# Patient Record
Sex: Female | Born: 1990 | Race: White | Hispanic: No | Marital: Single | Smoking: Former smoker
Health system: Southern US, Community
[De-identification: ages and names within clinical notes are randomized; demographics above are authoritative.]

## PROBLEM LIST (undated history)

## (undated) DIAGNOSIS — F32A Depression, unspecified: Secondary | ICD-10-CM

## (undated) DIAGNOSIS — F419 Anxiety disorder, unspecified: Secondary | ICD-10-CM

## (undated) DIAGNOSIS — F329 Major depressive disorder, single episode, unspecified: Secondary | ICD-10-CM

## (undated) DIAGNOSIS — R011 Cardiac murmur, unspecified: Secondary | ICD-10-CM

## (undated) DIAGNOSIS — R519 Headache, unspecified: Secondary | ICD-10-CM

## (undated) DIAGNOSIS — R51 Headache: Secondary | ICD-10-CM

## (undated) DIAGNOSIS — B009 Herpesviral infection, unspecified: Secondary | ICD-10-CM

## (undated) DIAGNOSIS — Z3490 Encounter for supervision of normal pregnancy, unspecified, unspecified trimester: Secondary | ICD-10-CM

## (undated) HISTORY — PX: NO PAST SURGERIES: SHX2092

---
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
## 2008-02-19 ENCOUNTER — Inpatient Hospital Stay (HOSPITAL_COMMUNITY): Admission: AD | Admit: 2008-02-19 | Discharge: 2008-02-21 | Payer: Self-pay | Admitting: Obstetrics and Gynecology
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
## 2011-04-04 NOTE — Discharge Summary (Signed)
NAME:  [name], [name] NO.:  [id number]   MEDICAL RECORD NO.:  [id number]          PATIENT TYPE:  INP   LOCATION:  0199                          FACILITY:  [facility]   PHYSICIAN:  Jasmine Pang, M.D. DATE OF BIRTH:  1991/05/26   DATE OF ADMISSION:  05/02/2005  DATE OF DISCHARGE:                                 DISCHARGE SUMMARY   IDENTIFICATION:  The patient is a 20 year old Caucasian female who I see on  an outpatient basis.  She had just been discharged form this unit one week  ago for severe mood and behavioral dysregulation.  Since discharge, she has  become increasingly violent towards her mother and agitated.   DICTATION ENDED HERE.       BHS/MEDQ  D:  05/03/2005  T:  05/04/2005  Job:  [id number]

## 2011-08-12 LAB — CBC
HCT: 29.7 — ABNORMAL LOW
HCT: 36.2
Hemoglobin: 10.4 — ABNORMAL LOW
Hemoglobin: 12.6
MCHC: 34.7
MCHC: 35.2
MCV: 96.9
MCV: 97.6
Platelets: 212
Platelets: 218
RBC: 3.06 — ABNORMAL LOW
RBC: 3.71 — ABNORMAL LOW
RDW: 12
RDW: 12.1
WBC: 11.2 — ABNORMAL HIGH
WBC: 22 — ABNORMAL HIGH

## 2011-08-12 LAB — RPR: RPR Ser Ql: NONREACTIVE

## 2012-04-28 ENCOUNTER — Ambulatory Visit (HOSPITAL_COMMUNITY): Payer: Federal, State, Local not specified - PPO | Admitting: Psychiatry

## 2012-05-30 ENCOUNTER — Emergency Department (HOSPITAL_COMMUNITY)
Admission: EM | Admit: 2012-05-30 | Discharge: 2012-05-31 | Disposition: A | Discharge status: Home / Self Care | Payer: Federal, State, Local not specified - PPO | Attending: Emergency Medicine | Admitting: Emergency Medicine

## 2012-05-30 ENCOUNTER — Encounter (HOSPITAL_COMMUNITY): Payer: Self-pay | Admitting: Emergency Medicine

## 2012-05-30 DIAGNOSIS — B9689 Other specified bacterial agents as the cause of diseases classified elsewhere: Secondary | ICD-10-CM | POA: Insufficient documentation

## 2012-05-30 DIAGNOSIS — R109 Unspecified abdominal pain: Secondary | ICD-10-CM

## 2012-05-30 DIAGNOSIS — N760 Acute vaginitis: Secondary | ICD-10-CM | POA: Insufficient documentation

## 2012-05-30 DIAGNOSIS — A499 Bacterial infection, unspecified: Secondary | ICD-10-CM | POA: Insufficient documentation

## 2012-05-30 DIAGNOSIS — A599 Trichomoniasis, unspecified: Secondary | ICD-10-CM | POA: Insufficient documentation

## 2012-05-30 LAB — BASIC METABOLIC PANEL
CO2: 28 mEq/L (ref 19–32)
Calcium: 9.4 mg/dL (ref 8.4–10.5)
Glucose, Bld: 115 mg/dL — ABNORMAL HIGH (ref 70–99)
Sodium: 140 mEq/L (ref 135–145)

## 2012-05-30 LAB — CBC WITH DIFFERENTIAL/PLATELET
Eosinophils Relative: 0 % (ref 0–5)
HCT: 36.4 % (ref 36.0–46.0)
Hemoglobin: 12.4 g/dL (ref 12.0–15.0)
Lymphocytes Relative: 16 % (ref 12–46)
Lymphs Abs: 2 10*3/uL (ref 0.7–4.0)
MCV: 95.5 fL (ref 78.0–100.0)
Platelets: 194 10*3/uL (ref 150–400)
RBC: 3.81 MIL/uL — ABNORMAL LOW (ref 3.87–5.11)
WBC: 12.8 10*3/uL — ABNORMAL HIGH (ref 4.0–10.5)

## 2012-05-30 LAB — URINALYSIS, ROUTINE W REFLEX MICROSCOPIC
Glucose, UA: NEGATIVE mg/dL
Specific Gravity, Urine: 1.019 (ref 1.005–1.030)
pH: 7 (ref 5.0–8.0)

## 2012-05-30 LAB — WET PREP, GENITAL: Yeast Wet Prep HPF POC: NONE SEEN

## 2012-05-30 LAB — URINE MICROSCOPIC-ADD ON

## 2012-05-30 LAB — POCT PREGNANCY, URINE: Preg Test, Ur: NEGATIVE

## 2012-05-30 NOTE — ED Notes (Addendum)
Pt reports she was watching tv on her bed when she had a sudden onset of abd pain mid ambilicus , reports normal BM today

## 2012-05-30 NOTE — ED Provider Notes (Signed)
History     CSN: [id number]  Arrival date & time 05/30/12  2152   First MD Initiated Contact with Patient 05/30/12 2217      Chief Complaint  Patient presents with  . Abdominal Pain    resolved    (Consider location/radiation/quality/duration/timing/severity/associated sxs/prior treatment) Patient is a 21 y.o. female presenting with abdominal pain. The history is provided by the patient.  Abdominal Pain The primary symptoms of the illness include abdominal pain and nausea. The primary symptoms of the illness do not include fever, fatigue, shortness of breath, vomiting, diarrhea, hematemesis, hematochezia, dysuria, vaginal discharge or vaginal bleeding. The current episode started less than 1 hour ago. The onset of the illness was sudden. The problem has been gradually improving.  The abdominal pain is located in the RLQ and epigastric region. The abdominal pain does not radiate. Relieved by: rest. Exacerbated by: nothing.  The patient has not had a change in bowel habit. Symptoms associated with the illness do not include chills, diaphoresis, urgency, hematuria or back pain.    History reviewed. No pertinent past medical history.  History reviewed. No pertinent past surgical history.  History reviewed. No pertinent family history.  History  Substance Use Topics  . Smoking status: Not on file  . Smokeless tobacco: Not on file  . Alcohol Use: Not on file    OB History    Grav Para Term Preterm Abortions TAB SAB Ect Mult Living                  Review of Systems  Constitutional: Negative for fever, chills, diaphoresis and fatigue.  HENT: Negative for ear pain, congestion, sore throat, facial swelling, mouth sores, trouble swallowing, neck pain and neck stiffness.   Eyes: Negative.   Respiratory: Negative for apnea, cough, chest tightness, shortness of breath and wheezing.   Cardiovascular: Negative for chest pain, palpitations and leg swelling.  Gastrointestinal: Positive  for nausea and abdominal pain. Negative for vomiting, diarrhea, hematochezia, abdominal distention and hematemesis.  Genitourinary: Negative for dysuria, urgency, hematuria, flank pain, vaginal bleeding, vaginal discharge, difficulty urinating, menstrual problem and pelvic pain.  Musculoskeletal: Negative for back pain and gait problem.  Skin: Negative for rash and wound.  Neurological: Negative for dizziness, tremors, seizures, syncope, facial asymmetry, numbness and headaches.  Psychiatric/Behavioral: Negative.   All other systems reviewed and are negative.    Allergies  Review of patient's allergies indicates no known allergies.  Home Medications   Current Outpatient Rx  Name Route Sig Dispense Refill  . ADULT MULTIVITAMIN W/MINERALS CH Oral Take 1 tablet by mouth daily.      BP 104/58  Pulse 80  Temp 99.2 F (37.3 C) (Oral)  Resp 16  SpO2 98%  Physical Exam  Nursing note and vitals reviewed. Constitutional: She is oriented to person, place, and time. She appears well-developed and well-nourished. No distress.  HENT:  Head: Normocephalic and atraumatic.  Right Ear: External ear normal.  Left Ear: External ear normal.  Nose: Nose normal.  Mouth/Throat: Oropharynx is clear and moist. No oropharyngeal exudate.  Eyes: Conjunctivae and EOM are normal. Pupils are equal, round, and reactive to light. Right eye exhibits no discharge. Left eye exhibits no discharge.  Neck: Normal range of motion. Neck supple. No JVD present. No tracheal deviation present. No thyromegaly present.  Cardiovascular: Normal rate, regular rhythm, normal heart sounds and intact distal pulses.  Exam reveals no gallop and no friction rub.   No murmur heard. Pulmonary/Chest: Effort normal and  breath sounds normal. No respiratory distress. She has no wheezes. She has no rales. She exhibits no tenderness.  Abdominal: Soft. Bowel sounds are normal. She exhibits no distension. There is tenderness in the right  lower quadrant. There is tenderness at McBurney's point. There is no rigidity, no rebound, no guarding and negative Murphy's sign.  Genitourinary: Vagina normal and uterus normal. There is no rash, tenderness or lesion on the right labia. There is no rash, tenderness or lesion on the left labia. Uterus is not deviated, not enlarged, not fixed and not tender. Cervix exhibits discharge (whitish discharge moderate amount). Cervix exhibits no motion tenderness and no friability. Right adnexum displays no mass, no tenderness and no fullness. Left adnexum displays no mass, no tenderness and no fullness. No erythema, tenderness or bleeding around the vagina. No foreign body around the vagina. No signs of injury around the vagina. No vaginal discharge found.       Patient pain seems more intra-abdominal on the right side not associated with the adnexa  Musculoskeletal: Normal range of motion.  Lymphadenopathy:    She has no cervical adenopathy.  Neurological: She is alert and oriented to person, place, and time. No cranial nerve deficit. Coordination normal.  Skin: Skin is warm. No rash noted. She is not diaphoretic.  Psychiatric: She has a normal mood and affect. Her behavior is normal. Judgment and thought content normal.    ED Course  Procedures (including critical care time)   Labs Reviewed  URINALYSIS, ROUTINE W REFLEX MICROSCOPIC  CBC WITH DIFFERENTIAL  BASIC METABOLIC PANEL  GC/CHLAMYDIA PROBE AMP, GENITAL  WET PREP, GENITAL   No results found.   No diagnosis found.    MDM  21 year old female patient with noncontributory past medical history presents with acute onset right lower quadrant pain. Patient says she was sitting at home which had acute onset periumbilical/right lower quadrant abdominal pain that was worse with breathing and movement. Patient says is now improving but she still has soreness there. Patient denies urinary symptoms. Patient is sexually active her last menstrual  period was 4 weeks ago is not having any vaginal bleeding vaginal discharge or pain with sex. Patient not have any other symptoms. Exam normal as above concern for possible appendicitis. We'll get CBC BMP urine labs and CT abdomen.  Results for orders placed during the hospital encounter of 05/30/12  URINALYSIS, ROUTINE W REFLEX MICROSCOPIC      Component Value Range   Color, Urine YELLOW  YELLOW   APPearance CLOUDY (*) CLEAR   Specific Gravity, Urine 1.019  1.005 - 1.030   pH 7.0  5.0 - 8.0   Glucose, UA NEGATIVE  NEGATIVE mg/dL   Hgb urine dipstick TRACE (*) NEGATIVE   Bilirubin Urine NEGATIVE  NEGATIVE   Ketones, ur NEGATIVE  NEGATIVE mg/dL   Protein, ur NEGATIVE  NEGATIVE mg/dL   Urobilinogen, UA 0.2  0.0 - 1.0 mg/dL   Nitrite NEGATIVE  NEGATIVE   Leukocytes, UA MODERATE (*) NEGATIVE  CBC WITH DIFFERENTIAL      Component Value Range   WBC 12.8 (*) 4.0 - 10.5 K/uL   RBC 3.81 (*) 3.87 - 5.11 MIL/uL   Hemoglobin 12.4  12.0 - 15.0 g/dL   HCT 16.1  09.6 - 04.5 %   MCV 95.5  78.0 - 100.0 fL   MCH 32.5  26.0 - 34.0 pg   MCHC 34.1  30.0 - 36.0 g/dL   RDW 40.9 (*) 81.1 - 91.4 %   Platelets  194  150 - 400 K/uL   Neutrophils Relative 76  43 - 77 %   Neutro Abs 9.7 (*) 1.7 - 7.7 K/uL   Lymphocytes Relative 16  12 - 46 %   Lymphs Abs 2.0  0.7 - 4.0 K/uL   Monocytes Relative 8  3 - 12 %   Monocytes Absolute 1.0  0.1 - 1.0 K/uL   Eosinophils Relative 0  0 - 5 %   Eosinophils Absolute 0.0  0.0 - 0.7 K/uL   Basophils Relative 0  0 - 1 %   Basophils Absolute 0.0  0.0 - 0.1 K/uL  BASIC METABOLIC PANEL      Component Value Range   Sodium 140  135 - 145 mEq/L   Potassium 3.3 (*) 3.5 - 5.1 mEq/L   Chloride 104  96 - 112 mEq/L   CO2 28  19 - 32 mEq/L   Glucose, Bld 115 (*) 70 - 99 mg/dL   BUN 13  6 - 23 mg/dL   Creatinine, Ser 1.61  0.50 - 1.10 mg/dL   Calcium 9.4  8.4 - 09.6 mg/dL   GFR calc non Af Amer >90  >90 mL/min   GFR calc Af Amer >90  >90 mL/min  WET PREP, GENITAL       Component Value Range   Yeast Wet Prep HPF POC NONE SEEN  NONE SEEN   Trich, Wet Prep MODERATE (*) NONE SEEN   Clue Cells Wet Prep HPF POC FEW (*) NONE SEEN   WBC, Wet Prep HPF POC FEW (*) NONE SEEN  POCT PREGNANCY, URINE      Component Value Range   Preg Test, Ur NEGATIVE  NEGATIVE  URINE MICROSCOPIC-ADD ON      Component Value Range   Squamous Epithelial / LPF MANY (*) RARE   WBC, UA 3-6  <3 WBC/hpf   RBC / HPF 0-2  <3 RBC/hpf   Bacteria, UA MANY (*) RARE    Labs show patient has evidence of bacterial vaginosis and trich. Patient still with acute onset right lower quadrant pain that is being assessed with imaging. At this point in time awaiting CT scan. Dr. Juleen China Will followup with that imaging and disposition patient   Case discussed with Dr. Sharlot Gowda, MD 05/31/12 0002

## 2012-05-30 NOTE — ED Notes (Addendum)
Pt. Resting comfortably. Pain described as "pressure" 1/10. Pt has complete 1 cup of contrast. Working on the 2nd cup. A.O. X 4. NAD.

## 2012-05-31 ENCOUNTER — Emergency Department (HOSPITAL_COMMUNITY): Payer: Federal, State, Local not specified - PPO

## 2012-05-31 ENCOUNTER — Encounter (HOSPITAL_COMMUNITY): Payer: Self-pay | Admitting: Radiology

## 2012-05-31 LAB — GC/CHLAMYDIA PROBE AMP, GENITAL: Chlamydia, DNA Probe: NEGATIVE

## 2012-05-31 MED ORDER — METRONIDAZOLE 500 MG PO TABS: 500.0000 mg | ORAL_TABLET | Freq: Two times a day (BID) | ORAL | Status: AC

## 2012-05-31 MED ORDER — IOHEXOL 300 MG/ML  SOLN
100.0000 mL | Freq: Once | INTRAMUSCULAR | Status: AC | PRN
  Administered 2012-05-31: 100 mL via INTRAVENOUS

## 2012-05-31 NOTE — ED Notes (Signed)
Patient back from CT; currently sitting up in bed; no respiratory or acute distress noted.  Patient updated on plan of care; informed patient that we are currently waiting on results from CT scan.  Patient has no other questions or concerns at this time; will continue to monitor.

## 2012-05-31 NOTE — ED Notes (Signed)
Dr. Kohut at bedside 

## 2012-05-31 NOTE — ED Notes (Signed)
Pt. D/C home.  Pt. Instructed to complete fully dose of antibiotic. Instructed not to have sex until antibiotic is complete and partner is treated. Significant other at bedside and instructed to seek treatment for testing ASAP. Pt. And significant other verbalized understanding of teaching. A.O. X 4. Ambulatory. NAD.

## 2012-06-03 NOTE — ED Provider Notes (Signed)
I saw and evaluated the patient, reviewed the resident's note and I agree with the findings and plan.  Ct Abdomen Pelvis W Contrast  05/31/2012  *RADIOLOGY REPORT*  Clinical Data: Mid umbilical abdominal pain  CT ABDOMEN AND PELVIS WITH CONTRAST  Technique:  Multidetector CT imaging of the abdomen and pelvis was performed following the standard protocol during bolus administration of intravenous contrast.  Contrast: OMNIPAQUE IOHEXOL 300 MG/ML  SOLN  Comparison: None.  Findings: Lung bases are clear.  Gallbladder is contracted but otherwise normal in appearance. Liver, adrenal glands, kidneys, spleen, and pancreas are normal. Stomach mildly distended with ingested material.  The appendix is normal.  No bowel wall thickening or focal segmental dilatation.  Small free pelvic fluid is noted.  Uterus and ovaries are normal.  Bladder is normal.  No radiopaque renal or ureteral calculus.  No acute osseous finding.  IMPRESSION: No acute intra-abdominal or pelvic pathology.  Specifically, the appendix is normal.  Small free pelvic fluid is likely physiologic in this premenopausal female.  Original Report Authenticated By: Harrel Lemon, M.D.   [phone] with abdominal pain which has almost completely resolved. Only mild RLQ tenderness on my exam without peritoneal signs. CT with no evidence of emergent pathology. Wet prep with trich and evidence of BV. Script for flagyl. Return precautions dicussed.  Raeford Razor, MD 06/03/12 [phone]

## 2012-07-15 ENCOUNTER — Encounter (HOSPITAL_COMMUNITY): Payer: Self-pay | Admitting: *Deleted

## 2012-07-15 ENCOUNTER — Emergency Department (HOSPITAL_COMMUNITY): Payer: Federal, State, Local not specified - PPO

## 2012-07-15 ENCOUNTER — Emergency Department (HOSPITAL_COMMUNITY)
Admission: EM | Admit: 2012-07-15 | Discharge: 2012-07-15 | Disposition: A | Discharge status: Home / Self Care | Payer: Federal, State, Local not specified - PPO | Attending: Emergency Medicine | Admitting: Emergency Medicine

## 2012-07-15 DIAGNOSIS — R51 Headache: Secondary | ICD-10-CM | POA: Insufficient documentation

## 2012-07-15 DIAGNOSIS — Z3490 Encounter for supervision of normal pregnancy, unspecified, unspecified trimester: Secondary | ICD-10-CM

## 2012-07-15 DIAGNOSIS — R55 Syncope and collapse: Secondary | ICD-10-CM

## 2012-07-15 DIAGNOSIS — W19XXXA Unspecified fall, initial encounter: Secondary | ICD-10-CM | POA: Insufficient documentation

## 2012-07-15 DIAGNOSIS — S0120XA Unspecified open wound of nose, initial encounter: Secondary | ICD-10-CM | POA: Insufficient documentation

## 2012-07-15 DIAGNOSIS — S0990XA Unspecified injury of head, initial encounter: Secondary | ICD-10-CM

## 2012-07-15 DIAGNOSIS — M542 Cervicalgia: Secondary | ICD-10-CM | POA: Insufficient documentation

## 2012-07-15 DIAGNOSIS — S0003XA Contusion of scalp, initial encounter: Secondary | ICD-10-CM | POA: Insufficient documentation

## 2012-07-15 DIAGNOSIS — R404 Transient alteration of awareness: Secondary | ICD-10-CM | POA: Insufficient documentation

## 2012-07-15 DIAGNOSIS — S139XXA Sprain of joints and ligaments of unspecified parts of neck, initial encounter: Secondary | ICD-10-CM | POA: Insufficient documentation

## 2012-07-15 DIAGNOSIS — S161XXA Strain of muscle, fascia and tendon at neck level, initial encounter: Secondary | ICD-10-CM

## 2012-07-15 DIAGNOSIS — O99891 Other specified diseases and conditions complicating pregnancy: Secondary | ICD-10-CM | POA: Insufficient documentation

## 2012-07-15 LAB — CBC WITH DIFFERENTIAL/PLATELET
Basophils Absolute: 0 10*3/uL (ref 0.0–0.1)
Basophils Relative: 0 % (ref 0–1)
Eosinophils Absolute: 0 10*3/uL (ref 0.0–0.7)
MCHC: 34 g/dL (ref 30.0–36.0)
Neutro Abs: 5.9 10*3/uL (ref 1.7–7.7)
Neutrophils Relative %: 69 % (ref 43–77)
RDW: 11.5 % (ref 11.5–15.5)

## 2012-07-15 LAB — URINE MICROSCOPIC-ADD ON

## 2012-07-15 LAB — URINALYSIS, ROUTINE W REFLEX MICROSCOPIC
Glucose, UA: NEGATIVE mg/dL
Specific Gravity, Urine: 1.025 (ref 1.005–1.030)
pH: 6 (ref 5.0–8.0)

## 2012-07-15 LAB — BASIC METABOLIC PANEL
Chloride: 105 mEq/L (ref 96–112)
Creatinine, Ser: 0.79 mg/dL (ref 0.50–1.10)
GFR calc Af Amer: >90 mL/min (ref >90)
GFR calc non Af Amer: >90 mL/min (ref >90)
Potassium: 3.7 mEq/L (ref 3.5–5.1)

## 2012-07-15 LAB — POCT PREGNANCY, URINE: Preg Test, Ur: NEGATIVE

## 2012-07-15 LAB — HCG, SERUM, QUALITATIVE: Preg, Serum: POSITIVE — AB

## 2012-07-15 NOTE — ED Provider Notes (Signed)
History     CSN: [id number]  Arrival date & time 07/15/12  0908   First MD Initiated Contact with Patient 07/15/12 1203      Chief Complaint  Patient presents with  . Loss of Consciousness    (Consider location/radiation/quality/duration/timing/severity/associated sxs/prior treatment) HPI Pt states she woke up around midnight and sat on toilet for BM. Became lightheaded and nauseated. She had unwitnessed syncopal episode for unknown duration. She continue to have HA and neck pain after the fall. She has had syncopal episodes in the past associated with the site of blood and pain. No visual changes, CP, abd pain, N/V/D, focal weakness, or sensory changes.  History reviewed. No pertinent past medical history.  History reviewed. No pertinent past surgical history.  History reviewed. No pertinent family history.  History  Substance Use Topics  . Smoking status: Never Smoker   . Smokeless tobacco: Not on file  . Alcohol Use: Yes     occ    OB History    Grav Para Term Preterm Abortions TAB SAB Ect Mult Living                  Review of Systems  Constitutional: Negative for fever, chills, diaphoresis and fatigue.  HENT: Positive for facial swelling and neck pain. Negative for neck stiffness.   Eyes: Negative for visual disturbance.  Cardiovascular: Negative for chest pain, palpitations and leg swelling.  Gastrointestinal: Positive for nausea. Negative for vomiting, abdominal pain and diarrhea.  Genitourinary: Negative for dysuria and frequency.  Musculoskeletal: Positive for myalgias. Negative for back pain.  Skin: Positive for wound. Negative for color change and rash.  Neurological: Positive for dizziness, syncope, light-headedness and headaches. Negative for weakness and numbness.    Allergies  Review of patient's allergies indicates no known allergies.  Home Medications   Current Outpatient Rx  Name Route Sig Dispense Refill  . OVER THE COUNTER MEDICATION Oral  Take 2 tablets by mouth daily. Over the counter vitamin.      BP 106/58  Pulse 79  Temp 98.8 F (37.1 C) (Oral)  Resp 21  SpO2 100%  LMP 06/15/2012  Physical Exam  Nursing note and vitals reviewed. Constitutional: She is oriented to person, place, and time. She appears well-developed and well-nourished. No distress.  HENT:  Head: Normocephalic.  Mouth/Throat: Oropharynx is clear and moist.       Small superficial laceration to bridge of nose with mild swelling and TTP. R periorbital ecchymosis. R forehead hematoma  Eyes: EOM are normal. Pupils are equal, round, and reactive to light.  Neck: Normal range of motion. Neck supple.       TTP over L paraspinous cervical. No midline TTP  Cardiovascular: Normal rate and regular rhythm.   Pulmonary/Chest: Effort normal and breath sounds normal. No respiratory distress. She has no wheezes. She has no rales.  Abdominal: Soft. Bowel sounds are normal.  Musculoskeletal: Normal range of motion. She exhibits no edema and no tenderness.  Neurological: She is alert and oriented to person, place, and time.       5/5 motor, sensation intact, CN II-XII intact.   Skin: Skin is warm and dry. No rash noted. No erythema.  Psychiatric: She has a normal mood and affect. Her behavior is normal.    ED Course  Procedures (including critical care time)  Labs Reviewed  URINALYSIS, ROUTINE W REFLEX MICROSCOPIC - Abnormal; Notable for the following:    Hgb urine dipstick SMALL (*)     All  other components within normal limits  CBC WITH DIFFERENTIAL - Abnormal; Notable for the following:    RBC 3.68 (*)     Hemoglobin 11.9 (*)     HCT 35.0 (*)     All other components within normal limits  BASIC METABOLIC PANEL - Abnormal; Notable for the following:    Glucose, Bld 109 (*)     All other components within normal limits  HCG, SERUM, QUALITATIVE - Abnormal; Notable for the following:    Preg, Serum POSITIVE (*)     All other components within normal  limits  POCT PREGNANCY, URINE  URINE MICROSCOPIC-ADD ON  LAB REPORT - SCANNED   Ct Head Wo Contrast  07/15/2012  *RADIOLOGY REPORT*  Clinical Data:  Fall, laceration to the face  CT HEAD WITHOUT CONTRAST CT CERVICAL SPINE WITHOUT CONTRAST  Technique:  Multidetector CT imaging of the head and cervical spine was performed following the standard protocol without intravenous contrast.  Multiplanar CT image reconstructions of the cervical spine were also generated.  Comparison:   None  CT HEAD  Findings: No intracranial hemorrhage.  No parenchymal contusion. No midline shift or mass effect.  Basilar cisterns are patent. No skull base fracture.  No fluid in the paranasal sinuses or mastoid air cells.  IMPRESSION: No intracranial trauma.  CT CERVICAL SPINE  Findings: Straightening of the normal cervical lordosis.  No prevertebral soft tissue swelling.  Normal alignment of cervical vertebral bodies.  No loss of vertebral body height.  Normal facet articulation.  Normal craniocervical junction.  No evidence epidural or paraspinal hematoma.  IMPRESSION:  1.  No evidence cervical spine fracture. 2. Straightening of the normal cervical lordosis may be secondary to position, muscle spasm, or ligamentous injury.   Original Report Authenticated By: Genevive Bi, M.D.    Ct Cervical Spine Wo Contrast  07/15/2012  *RADIOLOGY REPORT*  Clinical Data:  Fall, laceration to the face  CT HEAD WITHOUT CONTRAST CT CERVICAL SPINE WITHOUT CONTRAST  Technique:  Multidetector CT imaging of the head and cervical spine was performed following the standard protocol without intravenous contrast.  Multiplanar CT image reconstructions of the cervical spine were also generated.  Comparison:   None  CT HEAD  Findings: No intracranial hemorrhage.  No parenchymal contusion. No midline shift or mass effect.  Basilar cisterns are patent. No skull base fracture.  No fluid in the paranasal sinuses or mastoid air cells.  IMPRESSION: No  intracranial trauma.  CT CERVICAL SPINE  Findings: Straightening of the normal cervical lordosis.  No prevertebral soft tissue swelling.  Normal alignment of cervical vertebral bodies.  No loss of vertebral body height.  Normal facet articulation.  Normal craniocervical junction.  No evidence epidural or paraspinal hematoma.  IMPRESSION:  1.  No evidence cervical spine fracture. 2. Straightening of the normal cervical lordosis may be secondary to position, muscle spasm, or ligamentous injury.   Original Report Authenticated By: Genevive Bi, M.D.      1. Vaso vagal episode   2. Closed head injury   3. Cervical strain   4. Pregnancy      Date: 07/15/2012  Rate: 65  Rhythm: normal sinus rhythm  QRS Axis: normal  Intervals: normal  ST/T Wave abnormalities: normal  Conduction Disutrbances:none  Narrative Interpretation:   Old EKG Reviewed: none available     MDM   Pt states she is feeling better. No abd pain, pelvic pain, vaginal bleeding. Pt advised to f/u with OB/GYN      Onalee Hua  Ranae Palms, MD 07/16/12 [phone]

## 2012-07-15 NOTE — ED Notes (Signed)
Results of POC urine pregnancy not clear, resulted as negative, however, nurse first called and asked to add urine pregnancy to confirm any and all results.

## 2012-07-15 NOTE — ED Notes (Signed)
Pt c/o neck pain 7/10, upper lip pain and pain to 3 digit on left hand. Pt reports she woke up around 2300 last night to use the bathroom, while she was sitting on toilet when all of a sudden she became nauseous, next thing she woke up on the floor in her bathroom. Pt reports she thinks she passed out and hit the floor. Pt has swelling to 3rd digit on left hand, small laceration to upper nose and reports she can't move her neck to the left without severe pain.

## 2012-07-15 NOTE — ED Notes (Signed)
Pt reports she woke up around 11p to go to restroom and while she was using the restroom felt nauseas and next thing she knew she woke up on the ground. Pt with laceration to nose. Pt reports neck pain. No complaints of nausea or dizziness at this time. CCollar place in triage.

## 2013-07-01 LAB — OB RESULTS CONSOLE RUBELLA ANTIBODY, IGM: RUBELLA: IMMUNE

## 2013-07-01 LAB — OB RESULTS CONSOLE HEPATITIS B SURFACE ANTIGEN: Hepatitis B Surface Ag: NEGATIVE

## 2013-07-20 ENCOUNTER — Other Ambulatory Visit (HOSPITAL_COMMUNITY)
Admission: RE | Admit: 2013-07-20 | Discharge: 2013-07-20 | Disposition: A | Discharge status: Home / Self Care | Payer: Federal, State, Local not specified - PPO | Source: Ambulatory Visit | Attending: Obstetrics and Gynecology | Admitting: Obstetrics and Gynecology

## 2013-07-20 ENCOUNTER — Other Ambulatory Visit: Payer: Self-pay | Admitting: Obstetrics and Gynecology

## 2013-07-20 DIAGNOSIS — R87810 Cervical high risk human papillomavirus (HPV) DNA test positive: Secondary | ICD-10-CM | POA: Insufficient documentation

## 2013-07-20 DIAGNOSIS — Z01419 Encounter for gynecological examination (general) (routine) without abnormal findings: Secondary | ICD-10-CM | POA: Insufficient documentation

## 2013-07-20 DIAGNOSIS — Z1151 Encounter for screening for human papillomavirus (HPV): Secondary | ICD-10-CM | POA: Insufficient documentation

## 2013-07-20 DIAGNOSIS — Z113 Encounter for screening for infections with a predominantly sexual mode of transmission: Secondary | ICD-10-CM | POA: Insufficient documentation

## 2013-07-21 ENCOUNTER — Emergency Department (HOSPITAL_COMMUNITY)
Admission: EM | Admit: 2013-07-21 | Discharge: 2013-07-21 | Disposition: A | Discharge status: Home / Self Care | Payer: Federal, State, Local not specified - PPO | Attending: Emergency Medicine | Admitting: Emergency Medicine

## 2013-07-21 ENCOUNTER — Encounter (HOSPITAL_COMMUNITY): Payer: Self-pay | Admitting: Emergency Medicine

## 2013-07-21 ENCOUNTER — Emergency Department (HOSPITAL_COMMUNITY): Payer: Federal, State, Local not specified - PPO

## 2013-07-21 DIAGNOSIS — S300XXA Contusion of lower back and pelvis, initial encounter: Secondary | ICD-10-CM | POA: Insufficient documentation

## 2013-07-21 DIAGNOSIS — T7491XA Unspecified adult maltreatment, confirmed, initial encounter: Secondary | ICD-10-CM | POA: Insufficient documentation

## 2013-07-21 DIAGNOSIS — T148XXA Other injury of unspecified body region, initial encounter: Secondary | ICD-10-CM

## 2013-07-21 DIAGNOSIS — Z3490 Encounter for supervision of normal pregnancy, unspecified, unspecified trimester: Secondary | ICD-10-CM

## 2013-07-21 DIAGNOSIS — IMO0002 Reserved for concepts with insufficient information to code with codable children: Secondary | ICD-10-CM | POA: Insufficient documentation

## 2013-07-21 DIAGNOSIS — S8010XA Contusion of unspecified lower leg, initial encounter: Secondary | ICD-10-CM | POA: Insufficient documentation

## 2013-07-21 DIAGNOSIS — O99330 Smoking (tobacco) complicating pregnancy, unspecified trimester: Secondary | ICD-10-CM | POA: Insufficient documentation

## 2013-07-21 DIAGNOSIS — S6000XA Contusion of unspecified finger without damage to nail, initial encounter: Secondary | ICD-10-CM | POA: Insufficient documentation

## 2013-07-21 DIAGNOSIS — O9989 Other specified diseases and conditions complicating pregnancy, childbirth and the puerperium: Secondary | ICD-10-CM | POA: Insufficient documentation

## 2013-07-21 DIAGNOSIS — S5010XA Contusion of unspecified forearm, initial encounter: Secondary | ICD-10-CM | POA: Insufficient documentation

## 2013-07-21 DIAGNOSIS — T7492XA Unspecified child maltreatment, confirmed, initial encounter: Secondary | ICD-10-CM | POA: Insufficient documentation

## 2013-07-21 DIAGNOSIS — S0003XA Contusion of scalp, initial encounter: Secondary | ICD-10-CM | POA: Insufficient documentation

## 2013-07-21 HISTORY — DX: Encounter for supervision of normal pregnancy, unspecified, unspecified trimester: Z34.90
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
## 2013-07-24 NOTE — ED Provider Notes (Signed)
Medical screening examination/treatment/procedure(s) were performed by non-physician practitioner and as supervising physician I was immediately available for consultation/collaboration.   Winni Ehrhard Y. Zenna Traister, MD 07/24/13 2044 

## 2013-11-17 NOTE — L&D Delivery Note (Signed)
Delivery Note At 2:06 PM a viable female was delivered via Vaginal, Spontaneous Delivery (Presentation: Middle Occiput Anterior).  APGAR: 9, 9; weight 8 lb 14.3 oz (4034 g).   Placenta status: Intact, Spontaneous.  Cord: 3 vessels with the following complications: None.  Cord pH: NA  Anesthesia: None  Episiotomy: None  Lacerations: None  Suture Repair: NA Est. Blood Loss (mL): 450 ml  Pt had slow trickle of blood after delivery 1000 mcg of cytotec were placed per rectum with resolution   Mom to postpartum.  Baby to Couplet care / Skin to Skin.  Zion Ta J. 02/10/2014, 2:38 PM

## 2014-01-25 LAB — OB RESULTS CONSOLE GBS: STREP GROUP B AG: NEGATIVE

## 2014-01-28 ENCOUNTER — Encounter (HOSPITAL_COMMUNITY): Payer: Self-pay | Admitting: *Deleted

## 2014-01-28 ENCOUNTER — Inpatient Hospital Stay (HOSPITAL_COMMUNITY)
Admission: AD | Admit: 2014-01-28 | Discharge: 2014-01-28 | Disposition: A | Discharge status: Home / Self Care | Payer: Federal, State, Local not specified - PPO | Source: Ambulatory Visit | Attending: Obstetrics and Gynecology | Admitting: Obstetrics and Gynecology

## 2014-01-28 DIAGNOSIS — O479 False labor, unspecified: Secondary | ICD-10-CM | POA: Insufficient documentation

## 2014-01-28 DIAGNOSIS — N898 Other specified noninflammatory disorders of vagina: Secondary | ICD-10-CM | POA: Insufficient documentation

## 2014-01-28 HISTORY — DX: Herpesviral infection, unspecified: B00.9

## 2014-01-28 MED ORDER — OXYCODONE-ACETAMINOPHEN 5-325 MG PO TABS
1.0000 | ORAL_TABLET | Freq: Once | ORAL | Status: AC
  Administered 2014-01-28: 1 via ORAL
  Filled 2014-01-28: qty 1

## 2014-01-28 MED ORDER — ZOLPIDEM TARTRATE 5 MG PO TABS
5.0000 mg | ORAL_TABLET | Freq: Once | ORAL | Status: AC
  Administered 2014-01-28: 5 mg via ORAL
  Filled 2014-01-28: qty 1

## 2014-01-28 NOTE — Progress Notes (Signed)
Written and verbal d/c instructions given and understanding voiced. 
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
## 2014-01-28 NOTE — MAU Note (Signed)
Contractions on and off all day but regular since 2230. Some vaginal d/c but no leaking fld

## 2014-01-28 NOTE — Discharge Instructions (Signed)

## 2014-02-02 ENCOUNTER — Inpatient Hospital Stay (HOSPITAL_COMMUNITY)
Admission: AD | Admit: 2014-02-02 | Discharge: 2014-02-03 | Disposition: A | Discharge status: Home / Self Care | Payer: Federal, State, Local not specified - PPO | Source: Ambulatory Visit | Attending: Obstetrics and Gynecology | Admitting: Obstetrics and Gynecology

## 2014-02-02 ENCOUNTER — Encounter (HOSPITAL_COMMUNITY): Payer: Self-pay | Admitting: *Deleted

## 2014-02-02 DIAGNOSIS — O98519 Other viral diseases complicating pregnancy, unspecified trimester: Secondary | ICD-10-CM | POA: Insufficient documentation

## 2014-02-02 DIAGNOSIS — A6000 Herpesviral infection of urogenital system, unspecified: Secondary | ICD-10-CM | POA: Insufficient documentation

## 2014-02-02 DIAGNOSIS — O479 False labor, unspecified: Secondary | ICD-10-CM | POA: Insufficient documentation
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
## 2014-02-09 ENCOUNTER — Inpatient Hospital Stay (HOSPITAL_COMMUNITY)
Admission: AD | Admit: 2014-02-09 | Discharge: 2014-02-11 | DRG: 774 | Disposition: A | Discharge status: Home / Self Care | Payer: Federal, State, Local not specified - PPO | Source: Ambulatory Visit | Attending: Obstetrics and Gynecology | Admitting: Obstetrics and Gynecology

## 2014-02-09 ENCOUNTER — Encounter (HOSPITAL_COMMUNITY): Payer: Self-pay | Admitting: *Deleted

## 2014-02-09 DIAGNOSIS — Z3480 Encounter for supervision of other normal pregnancy, unspecified trimester: Secondary | ICD-10-CM

## 2014-02-09 DIAGNOSIS — D649 Anemia, unspecified: Secondary | ICD-10-CM | POA: Diagnosis not present

## 2014-02-09 DIAGNOSIS — O9903 Anemia complicating the puerperium: Secondary | ICD-10-CM | POA: Diagnosis not present

## 2014-02-09 DIAGNOSIS — O98519 Other viral diseases complicating pregnancy, unspecified trimester: Principal | ICD-10-CM | POA: Diagnosis present

## 2014-02-09 DIAGNOSIS — Z87891 Personal history of nicotine dependence: Secondary | ICD-10-CM

## 2014-02-09 DIAGNOSIS — O99344 Other mental disorders complicating childbirth: Secondary | ICD-10-CM | POA: Diagnosis present

## 2014-02-09 DIAGNOSIS — F1210 Cannabis abuse, uncomplicated: Secondary | ICD-10-CM | POA: Diagnosis present

## 2014-02-09 DIAGNOSIS — A6000 Herpesviral infection of urogenital system, unspecified: Secondary | ICD-10-CM | POA: Diagnosis present

## 2014-02-09 LAB — CBC
HEMATOCRIT: 33.3 % — AB (ref 36.0–46.0)
Hemoglobin: 11.7 g/dL — ABNORMAL LOW (ref 12.0–15.0)
MCH: 34.9 pg — ABNORMAL HIGH (ref 26.0–34.0)
MCHC: 35.1 g/dL (ref 30.0–36.0)
MCV: 99.4 fL (ref 78.0–100.0)
PLATELETS: 160 10*3/uL (ref 150–400)
RBC: 3.35 MIL/uL — AB (ref 3.87–5.11)
RDW: 13.3 % (ref 11.5–15.5)
WBC: 10.1 10*3/uL (ref 4.0–10.5)

## 2014-02-09 LAB — OB RESULTS CONSOLE ABO/RH: RH Type: POSITIVE

## 2014-02-09 LAB — OB RESULTS CONSOLE GC/CHLAMYDIA
Chlamydia: NEGATIVE
Gonorrhea: NEGATIVE

## 2014-02-09 LAB — OB RESULTS CONSOLE HIV ANTIBODY (ROUTINE TESTING): HIV: NONREACTIVE

## 2014-02-09 LAB — OB RESULTS CONSOLE ANTIBODY SCREEN: ANTIBODY SCREEN: NEGATIVE

## 2014-02-09 LAB — RPR: RPR Ser Ql: NONREACTIVE

## 2014-02-09 LAB — OB RESULTS CONSOLE RPR: RPR: NONREACTIVE

## 2014-02-09 MED ORDER — OXYTOCIN BOLUS FROM INFUSION
500.0000 mL | INTRAVENOUS | Status: DC
  Administered 2014-02-10: 500 mL via INTRAVENOUS

## 2014-02-09 MED ORDER — LACTATED RINGERS IV SOLN: INTRAVENOUS | Status: DC

## 2014-02-09 MED ORDER — LACTATED RINGERS IV SOLN
500.0000 mL | INTRAVENOUS | Status: DC | PRN
  Administered 2014-02-09: 1000 mL via INTRAVENOUS

## 2014-02-09 MED ORDER — ACETAMINOPHEN 325 MG PO TABS: 650.0000 mg | ORAL_TABLET | ORAL | Status: DC | PRN

## 2014-02-09 MED ORDER — ONDANSETRON HCL 4 MG/2ML IJ SOLN: 4.0000 mg | Freq: Four times a day (QID) | INTRAMUSCULAR | Status: DC | PRN

## 2014-02-09 MED ORDER — IBUPROFEN 600 MG PO TABS
600.0000 mg | ORAL_TABLET | Freq: Four times a day (QID) | ORAL | Status: DC | PRN
  Administered 2014-02-10: 600 mg via ORAL
  Filled 2014-02-09: qty 1

## 2014-02-09 MED ORDER — OXYCODONE-ACETAMINOPHEN 5-325 MG PO TABS
1.0000 | ORAL_TABLET | ORAL | Status: DC | PRN
  Administered 2014-02-10: 1 via ORAL
  Filled 2014-02-09: qty 1

## 2014-02-09 MED ORDER — LIDOCAINE HCL (PF) 1 % IJ SOLN
30.0000 mL | INTRAMUSCULAR | Status: DC | PRN
  Filled 2014-02-09: qty 30

## 2014-02-09 MED ORDER — CITRIC ACID-SODIUM CITRATE 334-500 MG/5ML PO SOLN: 30.0000 mL | ORAL | Status: DC | PRN

## 2014-02-09 MED ORDER — OXYTOCIN 40 UNITS IN LACTATED RINGERS INFUSION - SIMPLE MED: 62.5000 mL/h | INTRAVENOUS | Status: DC

## 2014-02-09 MED ORDER — BUTORPHANOL TARTRATE 1 MG/ML IJ SOLN
1.0000 mg | INTRAMUSCULAR | Status: DC | PRN
  Administered 2014-02-10 (×2): 1 mg via INTRAVENOUS
  Filled 2014-02-09 (×2): qty 1

## 2014-02-09 NOTE — H&P (Signed)
[name] is a 23 y.o. female G2 P1001 at 82 5/7 weeks presenting for contractions which started 6+ hours ago.  Have progressively increased throughout the day.  Denies LOF or VB.  Pt rates contractions 7-8/10.  Had last baby without an epidural.  Came early because she went from 6 cm to complete in 30 minutes. H/o HSV, last outbreak several years ago.  Lesions were "everywhere."  Denies prodromal symptoms. Has been taking prophylactic Valtrex since 35 weeks. Per Dr. Richardson Dopp, pt's first child was put up for adoption but it is currently an open adoption. Maternal Medical History:  Reason for admission: Contractions.   Contractions: Onset was 6-12 hours ago.   Frequency: regular.   Perceived severity is strong.    Fetal activity: Perceived fetal activity is normal.    Prenatal complications: no prenatal complications Prenatal Complications - Diabetes: none.    OB History   Grav Para Term Preterm Abortions TAB SAB Ect Mult Living   2 1 1  0 0 0 0 0 0 1     Past Medical History  Diagnosis Date  . Pregnancy   . Herpes    Past Surgical History  Procedure Laterality Date  . No past surgeries     Family History: family history includes Diabetes in her mother; Hypertension in her mother. Social History:  reports that she has quit smoking. She does not have any smokeless tobacco history on file. She reports that she drinks alcohol. She reports that she uses illicit drugs (Marijuana).   Prenatal Transfer Tool  Maternal Diabetes: No Genetic Screening: Normal Maternal Ultrasounds/Referrals: Normal Fetal Ultrasounds or other Referrals:  None Maternal Substance Abuse:  No Significant Maternal Medications:  Meds include: Other:  Valtrex Significant Maternal Lab Results:  Lab values include: Group B Strep negative Other Comments:  None  Review of Systems  Gastrointestinal: Positive for abdominal pain.    Dilation: 3 Effacement (%): Thick Station: Ballotable Exam by::  Myrah Strawderman Blood pressure 109/60, pulse 99, temperature 98.2 F (36.8 C), temperature source Oral, resp. rate 18, height 5' 3.5" (1.613 m), weight 67.677 kg (149 lb 3.2 oz), last menstrual period 05/14/2013. Maternal Exam:  Uterine Assessment: Contraction frequency is regular.   Abdomen: Fetal presentation: vertex  Introitus: Normal vulva. Normal vagina.  No herpetic lesions seen.  Pelvis: adequate for delivery.   Cervix: Cervix evaluated by sterile speculum exam and digital exam.     Fetal Exam Fetal Monitor Review: Mode: fetoscope.   Variability: moderate (6-25 bpm).   Pattern: accelerations present.    Fetal State Assessment: Category I - tracings are normal.    CERVIX:  VERY POSTERIOR, 3/30/-3, NOT WELL APPLIED Physical Exam  Constitutional: She appears well-developed and well-nourished. No distress.  HENT:  Head: Normocephalic and atraumatic.  Eyes: EOM are normal.  Neck: Normal range of motion.  Cardiovascular: Normal rate, regular rhythm and normal heart sounds.   Respiratory: Effort normal and breath sounds normal. No respiratory distress.  GI: There is no tenderness.  Musculoskeletal: Normal range of motion.  Neurological: She is alert.  Skin: Skin is warm and dry. She is not diaphoretic.  Psychiatric: She has a normal mood and affect.     Prenatal labs: ABO, Rh: O/Positive/-- (03/26 1808) Antibody: Negative (03/26 1808) Rubella:   RPR: Nonreactive (03/26 1808)  HBsAg:    HIV: Non-reactive (03/26 1808)  GBS:     Assessment/Plan: IUP at 38 5/7 weeks, Latent labor.  Recommend observation at this time.  Pt to ambulate x  1 hour.  If no cervical change, discharge home. Cat 1 tracing. H/o genital herpes.  No active lesions visualized. Pt desires to eat a regular diet.  Instructed that she can only have clear liquids at this time due to risk of aspiration.  If discharged, pt may eat a regular diet. Pt checked out to Dr. Estanislado Pandyivard and Charles[facility] GaribaldiNM [place].  Pt aware they were  assuming care.   Geryl RankinsVARNADO, Eulan Heyward 02/09/2014, 6:51 PM

## 2014-02-09 NOTE — Progress Notes (Signed)
  Subjective: Returned from walking, reports UCs now more intense and frequent.  Objective: BP 121/65  Pulse 91  Temp(Src) 98.4 F (36.9 C) (Oral)  Resp 18  Ht 5' 3.5" (1.613 m)  Wt 149 lb 3.2 oz (67.677 kg)  BMI 26.01 kg/m2  LMP 05/14/2013      FHT:  Category 1 UC:   regular, every 4-5 minutes, moderate SVE:   Dilation: 4.5 Effacement (%): 80 Station: -1 Exam by:: Nigel BridgemanVicki Mesha Schamberger CNM Cervix posterior, but soft, vtx low.  Assessment:  Early labor GBS negative Declines epidural, may want IV med.  Plan: Routine labor care Intermittent monitoring per patient request--encouraged to ambulate to facilitate contraction quality. Recheck cervix in 2 hours, with AROM/augmentation prn.  Nigel BridgemanLATHAM, Million Maharaj 02/09/2014, 9:04 PM

## 2014-02-09 NOTE — Progress Notes (Signed)
  Subjective: Breathing with some contractions--reports boyfriend is on his way.  Objective: BP 121/65  Pulse 91  Temp(Src) 98.4 F (36.9 C) (Oral)  Resp 18  Ht 5' 3.5" (1.613 m)  Wt 149 lb 3.2 oz (67.677 kg)  BMI 26.01 kg/m2  LMP 05/14/2013      FHT: Category 1 UC:   regular, every 3-4 minutes SVE:   Deferred at present  Assessment:  Early labor Declines pain med at present  Plan: Will await arrival of patient's partner prior to exam.  Nigel BridgemanLATHAM, Nury Nebergall 02/09/2014, 11:25 PM

## 2014-02-09 NOTE — MAU Note (Signed)
Patient states she is having contractions every 5 minutes. Denies bleeding or leaking and reports good fetal movement.  

## 2014-02-09 NOTE — MAU Note (Signed)
Pt states contractions woke her up out of her nap

## 2014-02-10 ENCOUNTER — Encounter (HOSPITAL_COMMUNITY): Payer: Self-pay | Admitting: *Deleted

## 2014-02-10 LAB — ABO/RH: ABO/RH(D): O POS

## 2014-02-10 MED ORDER — NALOXONE HCL 0.4 MG/ML IJ SOLN
INTRAMUSCULAR | Status: AC
  Filled 2014-02-10: qty 1

## 2014-02-10 MED ORDER — CYCLOBENZAPRINE HCL 10 MG PO TABS
10.0000 mg | ORAL_TABLET | Freq: Three times a day (TID) | ORAL | Status: DC | PRN
  Administered 2014-02-10: 10 mg via ORAL
  Filled 2014-02-10: qty 1

## 2014-02-10 MED ORDER — SENNOSIDES-DOCUSATE SODIUM 8.6-50 MG PO TABS
2.0000 | ORAL_TABLET | ORAL | Status: DC
  Administered 2014-02-11: 2 via ORAL
  Filled 2014-02-10: qty 2

## 2014-02-10 MED ORDER — TETANUS-DIPHTH-ACELL PERTUSSIS 5-2.5-18.5 LF-MCG/0.5 IM SUSP
0.5000 mL | Freq: Once | INTRAMUSCULAR | Status: DC
Start: 2014-02-11 — End: 2014-02-11

## 2014-02-10 MED ORDER — SIMETHICONE 80 MG PO CHEW: 80.0000 mg | CHEWABLE_TABLET | ORAL | Status: DC | PRN

## 2014-02-10 MED ORDER — ONDANSETRON HCL 4 MG/2ML IJ SOLN: 4.0000 mg | INTRAMUSCULAR | Status: DC | PRN

## 2014-02-10 MED ORDER — IBUPROFEN 600 MG PO TABS
600.0000 mg | ORAL_TABLET | Freq: Four times a day (QID) | ORAL | Status: DC
  Administered 2014-02-11 (×3): 600 mg via ORAL
  Filled 2014-02-10 (×3): qty 1

## 2014-02-10 MED ORDER — MISOPROSTOL 200 MCG PO TABS
ORAL_TABLET | ORAL | Status: AC
  Filled 2014-02-10: qty 1

## 2014-02-10 MED ORDER — WITCH HAZEL-GLYCERIN EX PADS: 1.0000 "application " | MEDICATED_PAD | CUTANEOUS | Status: DC | PRN

## 2014-02-10 MED ORDER — PRENATAL MULTIVITAMIN CH
1.0000 | ORAL_TABLET | Freq: Every day | ORAL | Status: DC
  Administered 2014-02-11: 1 via ORAL
  Filled 2014-02-10: qty 1

## 2014-02-10 MED ORDER — ONDANSETRON HCL 4 MG PO TABS: 4.0000 mg | ORAL_TABLET | ORAL | Status: DC | PRN

## 2014-02-10 MED ORDER — ZOLPIDEM TARTRATE 5 MG PO TABS: 5.0000 mg | ORAL_TABLET | Freq: Every evening | ORAL | Status: DC | PRN

## 2014-02-10 MED ORDER — METHYLERGONOVINE MALEATE 0.2 MG/ML IJ SOLN: 0.2000 mg | INTRAMUSCULAR | Status: DC | PRN

## 2014-02-10 MED ORDER — TERBUTALINE SULFATE 1 MG/ML IJ SOLN: 0.2500 mg | Freq: Once | INTRAMUSCULAR | Status: DC | PRN

## 2014-02-10 MED ORDER — BENZOCAINE-MENTHOL 20-0.5 % EX AERO
1.0000 "application " | INHALATION_SPRAY | CUTANEOUS | Status: DC | PRN
  Administered 2014-02-10: 1 via TOPICAL
  Filled 2014-02-10: qty 56

## 2014-02-10 MED ORDER — LANOLIN HYDROUS EX OINT: TOPICAL_OINTMENT | CUTANEOUS | Status: DC | PRN

## 2014-02-10 MED ORDER — METHYLERGONOVINE MALEATE 0.2 MG PO TABS: 0.2000 mg | ORAL_TABLET | ORAL | Status: DC | PRN

## 2014-02-10 MED ORDER — OXYTOCIN 40 UNITS IN LACTATED RINGERS INFUSION - SIMPLE MED
1.0000 m[IU]/min | INTRAVENOUS | Status: DC
  Filled 2014-02-10: qty 1000

## 2014-02-10 MED ORDER — OXYCODONE-ACETAMINOPHEN 5-325 MG PO TABS
1.0000 | ORAL_TABLET | ORAL | Status: DC | PRN
  Administered 2014-02-10: 1 via ORAL
  Filled 2014-02-10: qty 1

## 2014-02-10 MED ORDER — MISOPROSTOL 200 MCG PO TABS
1000.0000 ug | ORAL_TABLET | Freq: Once | ORAL | Status: AC
  Administered 2014-02-10: 1000 ug via VAGINAL

## 2014-02-10 MED ORDER — DIBUCAINE 1 % RE OINT
1.0000 | TOPICAL_OINTMENT | RECTAL | Status: DC | PRN
Start: 2014-02-10 — End: 2014-02-11

## 2014-02-10 MED ORDER — DIPHENHYDRAMINE HCL 25 MG PO CAPS: 25.0000 mg | ORAL_CAPSULE | Freq: Four times a day (QID) | ORAL | Status: DC | PRN

## 2014-02-10 MED ORDER — MISOPROSTOL 200 MCG PO TABS
ORAL_TABLET | ORAL | Status: AC
Start: 2014-02-10 — End: 2014-02-11
  Filled 2014-02-10: qty 5

## 2014-02-10 NOTE — Progress Notes (Signed)
Pt requested infant be taken away until after delivery of placenta.  Infant returned to pt after delivery of placenta and vaginal bldg controlled

## 2014-02-10 NOTE — Progress Notes (Signed)
[name] is a 23 y.o. G2P1001 at 4471w6d by LMP admitted for active labor  Subjective:   pt reports strong contractions. She does not want an epidural  Objective: BP 105/66  Pulse 99  Temp(Src) 98.4 F (36.9 C) (Oral)  Resp 16  Ht 5' 3.5" (1.613 m)  Wt 67.677 kg (149 lb 3.2 oz)  BMI 26.01 kg/m2  LMP 05/14/2013      FHT:  FHR: 120 bpm, variability: moderate,  accelerations:  Present,  decelerations:  Absent UC:   regular, every 2-3 minutes SVE:   Dilation: 7 Effacement (%): 90 Station: 0 Exam by:: Dr Richardson Doppole  Labs: Lab Results  Component Value Date   WBC 10.1 02/09/2014   HGB 11.7* 02/09/2014   HCT 33.3* 02/09/2014   MCV 99.4 02/09/2014   PLT 160 02/09/2014    Assessment / Plan: Augmentation of labor, progressing well  Labor: Progressing on Pitocin  Preeclampsia:  NA Fetal Wellbeing:  Category I Pain Control:  Labor support without medications I/D:  n/a Anticipated MOD:  NSVD  Chosen Geske J. 02/10/2014, 1:27 PM

## 2014-02-10 NOTE — Progress Notes (Addendum)
  Subjective: Resting quietly--she reports contractions are painful, but admits they are more widely spaced.  Partner has not arrived yet.  Objective: BP 121/65  Pulse 91  Temp(Src) 98.4 F (36.9 C) (Oral)  Resp 18  Ht 5' 3.5" (1.613 m)  Wt 149 lb 3.2 oz (67.677 kg)  BMI 26.01 kg/m2  LMP 05/14/2013      FHT:  Category 1 UC:   Irregular, mild SVE:   3 cm, 75%, vtx, -1, cervix still posterior. Cervix not stretchy now, more firm.  Assessment:  Likely prodromal labor Category 1 FHR GBS negative  Plan: Reviewed status with patient--I do not recommend AROM/augmentation, since patient is 6338 6/7 weeks and maternal/fetal status is reassuring.  Will continue to observe till the am, and [facility] regarding [facility] of care. Patient requests Flexeril--takes at home.  Brett Soza 02/10/2014, 2:40 AM

## 2014-02-10 NOTE — Progress Notes (Signed)
  Subjective: Reports contractions more painful, but now irregular.  Partner still not here--patient reports she expects him soon.  Objective: BP 121/65  Pulse 91  Temp(Src) 98.4 F (36.9 C) (Oral)  Resp 18  Ht 5' 3.5" (1.613 m)  Wt 149 lb 3.2 oz (67.677 kg)  BMI 26.01 kg/m2  LMP 05/14/2013      FHT: Category 1 UC:   irregular, every 2-4 minutes SVE:   Deferred at present--awaiting partner  Assessment:  Early labor Hx rapid labor  Plan: Will await partner arrival before next VE, potential AROM.   Granvil Djordjevic 02/10/2014, 1:32 AM

## 2014-02-11 LAB — CBC
HEMATOCRIT: 34.3 % — AB (ref 36.0–46.0)
HEMOGLOBIN: 11.9 g/dL — AB (ref 12.0–15.0)
MCH: 34.6 pg — ABNORMAL HIGH (ref 26.0–34.0)
MCHC: 34.7 g/dL (ref 30.0–36.0)
MCV: 99.7 fL (ref 78.0–100.0)
Platelets: 179 10*3/uL (ref 150–400)
RBC: 3.44 MIL/uL — ABNORMAL LOW (ref 3.87–5.11)
RDW: 13.3 % (ref 11.5–15.5)
WBC: 15.8 10*3/uL — ABNORMAL HIGH (ref 4.0–10.5)

## 2014-02-11 MED ORDER — OXYCODONE-ACETAMINOPHEN 5-325 MG PO TABS: 1.0000 | ORAL_TABLET | ORAL | Status: DC | PRN

## 2014-02-11 MED ORDER — IBUPROFEN 800 MG PO TABS: 800.0000 mg | ORAL_TABLET | Freq: Three times a day (TID) | ORAL | Status: DC | PRN

## 2014-02-11 NOTE — Discharge Summary (Signed)
Vaginal Delivery Discharge Summary  [name]  DOB:    January 23, 1991 MRN:    [id number] CSN:    [id number]  Date of admission:                  02/09/14  Date of discharge:                   02/11/14  Procedures this admission: SVD with no laceration  Date of Delivery: 02/10/14 by Dr Richardson Dopp  Newborn Data:  Live born female  Birth Weight: 8 lb 14.3 oz (4034 g) APGAR: 9, 9  Home with mother. Circumcision Plan: Outpatient  History of Present Illness:  Ms. [name] is a 23 y.o. female, G2P2002, who presents at [redacted]w[redacted]d weeks gestation. The patient has been followed at the [facility] division of [facility]. She was admitted onset of labor. Her pregnancy has been complicated by:  Patient Active Problem List   Diagnosis Date Noted  . Labor, prolonged latent phase 02/10/2014  . Supervision of other normal pregnancy 02/09/2014     Hospital course:  The patient was admitted for labor.   Her labor was not complicated. She proceeded to have a vaginal delivery of a healthy infant. Her delivery was not complicated. Her postpartum course was not complicated.  She was discharged to home on postpartum day 1 doing well.  Feeding:  breast  Contraception:  undecided  Discharge hemoglobin:  Hemoglobin  Date Value Ref Range Status  02/11/2014 11.9* 12.0 - 15.0 g/dL Final     HCT  Date Value Ref Range Status  02/11/2014 34.3* 36.0 - 46.0 % Final     Intrapartum Procedures: spontaneous vaginal delivery Postpartum Procedures: none Complications-Operative and Postpartum: none  Discharge Diagnoses: Term Pregnancy-delivered  Discharge Information:  Activity:           pelvic rest Diet:                routine Medications: PNV, Ibuprofen and Percocet Condition:      stable Instructions:   Postpartum Care After Vaginal Delivery  After you deliver your newborn (postpartum period), the usual stay in the hospital is  24 72 hours. If there were problems with your labor or delivery, or if you have other medical problems, you might be in the hospital longer.  While you are in the hospital, you will receive help and instructions on how to care for yourself and your newborn during the postpartum period.  While you are in the hospital:  Be sure to tell your nurses if you have pain or discomfort, as well as where you feel the pain and what makes the pain worse.  If you had an incision made near your vagina (episiotomy) or if you had some tearing during delivery, the nurses may put ice packs on your episiotomy or tear. The ice packs may help to reduce the pain and swelling.  If you are breastfeeding, you may feel uncomfortable contractions of your uterus for a couple of weeks. This is normal. The contractions help your uterus get back to normal size.  It is normal to have some bleeding after delivery.  For the first 1 3 days after delivery, the flow is red and the amount may be similar to a period.  It is common for the flow to start and stop.  In the first few days, you may pass some small clots. Let your nurses know if you begin to pass large  clots or your flow increases.  Do not  flush blood clots down the toilet before having the nurse look at them.  During the next 3 10 days after delivery, your flow should become more watery and pink or brown-tinged in color.  Ten to fourteen days after delivery, your flow should be a small amount of yellowish-white discharge.  The amount of your flow will decrease over the first few weeks after delivery. Your flow may stop in 6 8 weeks. Most women have had their flow stop by 12 weeks after delivery.  You should change your sanitary pads frequently.  Wash your hands thoroughly with soap and water for at least 20 seconds after changing pads, using the toilet, or before holding or feeding your newborn.  You should feel like you need to empty your bladder within the  first 6 8 hours after delivery.  In case you become weak, lightheaded, or faint, call your nurse before you get out of bed for the first time and before you take a shower for the first time.  Within the first few days after delivery, your breasts may begin to feel tender and full. This is called engorgement. Breast tenderness usually goes away within 48 72 hours after engorgement occurs. You may also notice milk leaking from your breasts. If you are not breastfeeding, do not stimulate your breasts. Breast stimulation can make your breasts produce more milk.  Spending as much time as possible with your newborn is very important. During this time, you and your newborn can feel close and get to know each other. Having your newborn stay in your room (rooming in) will help to strengthen the bond with your newborn. It will give you time to get to know your newborn and become comfortable caring for your newborn.  Your hormones change after delivery. Sometimes the hormone changes can temporarily cause you to feel sad or tearful. These feelings should not last more than a few days. If these feelings last longer than that, you should talk to your caregiver.  If desired, talk to your caregiver about methods of family planning or contraception.  Talk to your caregiver about immunizations. Your caregiver may want you to have the following immunizations before leaving the hospital:  Tetanus, diphtheria, and pertussis (Tdap) or tetanus and diphtheria (Td) immunization. It is very important that you and your family (including grandparents) or others caring for your newborn are up-to-date with the Tdap or Td immunizations. The Tdap or Td immunization can help protect your newborn from getting ill.  Rubella immunization.  Varicella (chickenpox) immunization.  Influenza immunization. You should receive this annual immunization if you did not receive the immunization during your pregnancy. Document Released:  08/31/2007 Document Revised: 07/28/2012 Document Reviewed: 06/30/2012 [facility] Patient Information 2014 [place], [place].   Postpartum Depression and Baby Blues  The postpartum period begins right after the birth of a baby. During this time, there is often a great amount of joy and excitement. It is also a time of considerable changes in the life of the parent(s). Regardless of how many times a mother gives birth, each child brings new challenges and dynamics to the family. It is not unusual to have feelings of excitement accompanied by confusing shifts in moods, emotions, and thoughts. All mothers are at risk of developing postpartum depression or the "baby blues." These mood changes can occur right after giving birth, or they may occur many months after giving birth. The baby blues or postpartum depression can be mild  or severe. Additionally, postpartum depression can resolve rather quickly, or it can be a long-term condition. CAUSES Elevated hormones and their rapid decline are thought to be a main cause of postpartum depression and the baby blues. There are a number of hormones that radically change during and after pregnancy. Estrogen and progesterone usually decrease immediately after delivering your baby. The level of thyroid hormone and various cortisol steroids also rapidly drop. Other factors that play a major role in these changes include major life events and genetics.  RISK FACTORS If you have any of the following risks for the baby blues or postpartum depression, know what symptoms to watch out for during the postpartum period. Risk factors that may increase the likelihood of getting the baby blues or postpartum depression include:  Havinga personal or family history of depression.  Having depression while being pregnant.  Having premenstrual or oral contraceptive-associated mood issues.  Having exceptional life stress.  Having marital conflict.  Lacking a social support  network.  Having a baby with special needs.  Having health problems such as diabetes. SYMPTOMS Baby blues symptoms include:  Brief fluctuations in mood, such as going from extreme happiness to sadness.  Decreased concentration.  Difficulty sleeping.  Crying spells, tearfulness.  Irritability.  Anxiety. Postpartum depression symptoms typically begin within the first month after giving birth. These symptoms include:  Difficulty sleeping or excessive sleepiness.  Marked weight loss.  Agitation.  Feelings of worthlessness.  Lack of interest in activity or food. Postpartum psychosis is a very concerning condition and can be dangerous. Fortunately, it is rare. Displaying any of the following symptoms is cause for immediate medical attention. Postpartum psychosis symptoms include:  Hallucinations and delusions.  Bizarre or disorganized behavior.  Confusion or disorientation. DIAGNOSIS  A diagnosis is made by an evaluation of your symptoms. There are no medical or lab tests that lead to a diagnosis, but there are various questionnaires that a caregiver may use to identify those with the baby blues, postpartum depression, or psychosis. Often times, a screening tool called the [place] Postnatal Depression Scale is used to diagnose depression in the postpartum period.  TREATMENT The baby blues usually goes away on its own in 1 to 2 weeks. Social support is often all that is needed. You should be encouraged to get adequate sleep and rest. Occasionally, you may be given medicines to help you sleep.  Postpartum depression requires treatment as it can last several months or longer if it is not treated. Treatment may include individual or group therapy, medicine, or both to address any social, physiological, and psychological factors that may play a role in the depression. Regular exercise, a healthy diet, rest, and social support may also be strongly recommended.  Postpartum psychosis is  more serious and needs treatment right away. Hospitalization is often needed. HOME CARE INSTRUCTIONS  Get as much rest as you can. Nap when the baby sleeps.  Exercise regularly. Some women find yoga and walking to be beneficial.  Eat a balanced and nourishing diet.  Do little things that you enjoy. Have a cup of tea, take a bubble bath, read your favorite magazine, or listen to your favorite music.  Avoid alcohol.  Ask for help with household chores, cooking, grocery shopping, or running errands as needed. Do not try to do everything.  Talk to people close to you about how you are feeling. Get support from your partner, family members, friends, or other new moms.  Try to stay positive in how you  think. Think about the things you are grateful for.  Do not spend a lot of time alone.  Only take medicine as directed by your caregiver.  Keep all your postpartum appointments.  Let your caregiver know if you have any concerns. SEEK MEDICAL CARE IF: You are having a reaction or problems with your medicine. SEEK IMMEDIATE MEDICAL CARE IF:  You have suicidal feelings.  You feel you may harm the baby or someone else. Document Released: 08/07/2004 Document Revised: 01/26/2012 Document Reviewed: 09/09/2011 [facility] Patient Information 2014 [place], [place].   Discharge to: home  Follow-up Information   Follow up with Jessee Avers., MD. Schedule an appointment as soon as possible for a visit in 2 weeks. ( pt has h/o depression .. ppv to evaluate for postpartum depression )    Specialty:  Obstetrics and Gynecology   Contact information:   [address] [place] [address] [phone]       Follow up with Jessee Avers., MD In 6 weeks.   Specialty:  Obstetrics and Gynecology   Contact information:   [address] [place] [address] [phone]        Haroldine Laws 02/11/2014

## 2014-02-11 NOTE — Discharge Instructions (Signed)
Postpartum Depression and Baby Blues °The postpartum period begins right after the birth of a baby. During this time, there is often a great amount of joy and excitement. It is also a time of considerable changes in the life of the parent(s). Regardless of how many times a mother gives birth, each child brings new challenges and dynamics to the family. It is not unusual to have feelings of excitement accompanied by confusing shifts in moods, emotions, and thoughts. All mothers are at risk of developing postpartum depression or the "baby blues." These mood changes can occur right after giving birth, or they may occur many months after giving birth. The baby blues or postpartum depression can be mild or severe. Additionally, postpartum depression can resolve rather quickly, or it can be a long-term condition. °CAUSES °Elevated hormones and their rapid decline are thought to be a main cause of postpartum depression and the baby blues. There are a number of hormones that radically change during and after pregnancy. Estrogen and progesterone usually decrease immediately after delivering your baby. The level of thyroid hormone and various cortisol steroids also rapidly drop. Other factors that play a major role in these changes include major life events and genetics.  °RISK FACTORS °If you have any of the following risks for the baby blues or postpartum depression, know what symptoms to watch out for during the postpartum period. Risk factors that may increase the likelihood of getting the baby blues or postpartum depression include: °· Having a personal or family history of depression. °· Having depression while being pregnant. °· Having premenstrual or oral contraceptive-associated mood issues. °· Having exceptional life stress. °· Having marital conflict. °· Lacking a social support network. °· Having a baby with special needs. °· Having health problems such as diabetes. °SYMPTOMS °Baby blues symptoms include: °· Brief  fluctuations in mood, such as going from extreme happiness to sadness. °· Decreased concentration. °· Difficulty sleeping. °· Crying spells, tearfulness. °· Irritability. °· Anxiety. °Postpartum depression symptoms typically begin within the first month after giving birth. These symptoms include: °· Difficulty sleeping or excessive sleepiness. °· Marked weight loss. °· Agitation. °· Feelings of worthlessness. °· Lack of interest in activity or food. °Postpartum psychosis is a very concerning condition and can be dangerous. Fortunately, it is rare. Displaying any of the following symptoms is cause for immediate medical attention. Postpartum psychosis symptoms include: °· Hallucinations and delusions. °· Bizarre or disorganized behavior. °· Confusion or disorientation. °DIAGNOSIS  °A diagnosis is made by an evaluation of your symptoms. There are no medical or lab tests that lead to a diagnosis, but there are various questionnaires that a caregiver may use to identify those with the baby blues, postpartum depression, or psychosis. Often times, a screening tool called the Edinburgh Postnatal Depression Scale is used to diagnose depression in the postpartum period.  °TREATMENT °The baby blues usually goes away on its own in 1 to 2 weeks. Social support is often all that is needed. You should be encouraged to get adequate sleep and rest. Occasionally, you may be given medicines to help you sleep.  °Postpartum depression requires treatment as it can last several months or longer if it is not treated. Treatment may include individual or group therapy, medicine, or both to address any social, physiological, and psychological factors that may play a role in the depression. Regular exercise, a healthy diet, rest, and social support may also be strongly recommended.  °Postpartum psychosis is more serious and needs treatment right away. Hospitalization is   often needed. °HOME CARE INSTRUCTIONS °· Get as much rest as you can. Nap  when the baby sleeps. °· Exercise regularly. Some women find yoga and walking to be beneficial. °· Eat a balanced and nourishing diet. °· Do little things that you enjoy. Have a cup of tea, take a bubble bath, read your favorite magazine, or listen to your favorite music. °· Avoid alcohol. °· Ask for help with household chores, cooking, grocery shopping, or running errands as needed. Do not try to do everything. °· Talk to people close to you about how you are feeling. Get support from your partner, family members, friends, or other new moms. °· Try to stay positive in how you think. Think about the things you are grateful for. °· Do not spend a lot of time alone. °· Only take medicine as directed by your caregiver. °· Keep all your postpartum appointments. °· Let your caregiver know if you have any concerns. °SEEK MEDICAL CARE IF: °You are having a reaction or problems with your medicine. °SEEK IMMEDIATE MEDICAL CARE IF: °· You have suicidal feelings. °· You feel you may harm the baby or someone else. °Document Released: 08/07/2004 Document Revised: 01/26/2012 Document Reviewed: 08/15/2013 °ExitCare® Patient Information ©2014 [employer]. °Postpartum Care After Vaginal Delivery °After you deliver your newborn (postpartum period), the usual stay in the hospital is 24 72 hours. If there were problems with your labor or delivery, or if you have other medical problems, you might be in the hospital longer.  °While you are in the hospital, you will receive help and instructions on how to care for yourself and your newborn during the postpartum period.  °While you are in the hospital: °· Be sure to tell your nurses if you have pain or discomfort, as well as where you feel the pain and what makes the pain worse. °· If you had an incision made near your vagina (episiotomy) or if you had some tearing during delivery, the nurses may put ice packs on your episiotomy or tear. The ice packs may help to reduce the pain and  swelling. °· If you are breastfeeding, you may feel uncomfortable contractions of your uterus for a couple of weeks. This is normal. The contractions help your uterus get back to normal size. °· It is normal to have some bleeding after delivery. °· For the first 1 3 days after delivery, the flow is red and the amount may be similar to a period. °· It is common for the flow to start and stop. °· In the first few days, you may pass some small clots. Let your nurses know if you begin to pass large clots or your flow increases. °· Do not  flush blood clots down the toilet before having the nurse look at them. °· During the next 3 10 days after delivery, your flow should become more watery and pink or brown-tinged in color. °· Ten to fourteen days after delivery, your flow should be a small amount of yellowish-white discharge. °· The amount of your flow will decrease over the first few weeks after delivery. Your flow may stop in 6 8 weeks. Most women have had their flow stop by 12 weeks after delivery. °· You should change your sanitary pads frequently. °· Wash your hands thoroughly with soap and water for at least 20 seconds after changing pads, using the toilet, or before holding or feeding your newborn. °· You should feel like you need to empty your bladder within the first   6 8 hours after delivery. °· In case you become weak, lightheaded, or faint, call your nurse before you get out of bed for the first time and before you take a shower for the first time. °· Within the first few days after delivery, your breasts may begin to feel tender and full. This is called engorgement. Breast tenderness usually goes away within 48 72 hours after engorgement occurs. You may also notice milk leaking from your breasts. If you are not breastfeeding, do not stimulate your breasts. Breast stimulation can make your breasts produce more milk. °· Spending as much time as possible with your newborn is very important. During this time,  you and your newborn can feel close and get to know each other. Having your newborn stay in your room (rooming in) will help to strengthen the bond with your newborn.  It will give you time to get to know your newborn and become comfortable caring for your newborn. °· Your hormones change after delivery. Sometimes the hormone changes can temporarily cause you to feel sad or tearful. These feelings should not last more than a few days. If these feelings last longer than that, you should talk to your caregiver. °· If desired, talk to your caregiver about methods of family planning or contraception. °· Talk to your caregiver about immunizations. Your caregiver may want you to have the following immunizations before leaving the hospital: °· Tetanus, diphtheria, and pertussis (Tdap) or tetanus and diphtheria (Td) immunization. It is very important that you and your family (including grandparents) or others caring for your newborn are up-to-date with the Tdap or Td immunizations. The Tdap or Td immunization can help protect your newborn from getting ill. °· Rubella immunization. °· Varicella (chickenpox) immunization. °· Influenza immunization. You should receive this annual immunization if you did not receive the immunization during your pregnancy. °Document Released: 08/31/2007 Document Revised: 07/28/2012 Document Reviewed: 06/30/2012 °ExitCare® Patient Information ©2014 [employer]. ° °

## 2014-02-11 NOTE — Progress Notes (Signed)
Post Partum Day 1  Subjective: no complaints and tolerating PO  Objective: Blood pressure 94/57, pulse 75, temperature 98 F (36.7 C), temperature source Oral, resp. rate 20, height 5' 3.5" (1.613 m), weight 149 lb 3.2 oz (67.677 kg), last menstrual period 05/14/2013, unknown if currently breastfeeding.  Physical Exam:  General: alert and no distress Lochia: appropriate Uterine Fundus: firm Incision: NA DVT Evaluation: No evidence of DVT seen on physical exam.   Recent Labs  02/09/14 1735 02/11/14 0600  HGB 11.7* 11.9*  HCT 33.3* 34.3*    Assessment/Plan: Breastfeeding and Contraception Undecided Anemia Wants to go home later today. Plans for outpatient circumcision.   LOS: 2 days   [name],[name] 02/11/2014, 9:47 AM

## 2014-06-20 ENCOUNTER — Emergency Department (HOSPITAL_COMMUNITY)
Admission: EM | Admit: 2014-06-20 | Discharge: 2014-06-21 | Disposition: A | Discharge status: Home / Self Care | Payer: Federal, State, Local not specified - PPO | Attending: Emergency Medicine | Admitting: Emergency Medicine

## 2014-06-20 ENCOUNTER — Encounter (HOSPITAL_COMMUNITY): Payer: Self-pay | Admitting: Emergency Medicine

## 2014-06-20 ENCOUNTER — Emergency Department (HOSPITAL_COMMUNITY): Payer: Federal, State, Local not specified - PPO

## 2014-06-20 DIAGNOSIS — Z87891 Personal history of nicotine dependence: Secondary | ICD-10-CM | POA: Insufficient documentation

## 2014-06-20 DIAGNOSIS — S62307A Unspecified fracture of fifth metacarpal bone, left hand, initial encounter for closed fracture: Secondary | ICD-10-CM

## 2014-06-20 DIAGNOSIS — S199XXA Unspecified injury of neck, initial encounter: Secondary | ICD-10-CM

## 2014-06-20 DIAGNOSIS — S060X9A Concussion with loss of consciousness of unspecified duration, initial encounter: Secondary | ICD-10-CM | POA: Insufficient documentation

## 2014-06-20 DIAGNOSIS — Z79899 Other long term (current) drug therapy: Secondary | ICD-10-CM | POA: Insufficient documentation

## 2014-06-20 DIAGNOSIS — S0993XA Unspecified injury of face, initial encounter: Secondary | ICD-10-CM | POA: Insufficient documentation

## 2014-06-20 DIAGNOSIS — S0990XA Unspecified injury of head, initial encounter: Secondary | ICD-10-CM

## 2014-06-20 DIAGNOSIS — S62319A Displaced fracture of base of unspecified metacarpal bone, initial encounter for closed fracture: Secondary | ICD-10-CM | POA: Insufficient documentation

## 2014-06-20 DIAGNOSIS — S6990XA Unspecified injury of unspecified wrist, hand and finger(s), initial encounter: Secondary | ICD-10-CM | POA: Insufficient documentation

## 2014-06-20 MED ORDER — HYDROCODONE-ACETAMINOPHEN 5-325 MG PO TABS
1.0000 | ORAL_TABLET | Freq: Once | ORAL | Status: AC
  Administered 2014-06-20: 1 via ORAL
  Filled 2014-06-20: qty 1

## 2014-06-20 MED ORDER — IBUPROFEN 800 MG PO TABS
800.0000 mg | ORAL_TABLET | Freq: Once | ORAL | Status: AC
  Administered 2014-06-20: 800 mg via ORAL
  Filled 2014-06-20: qty 1
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
## 2014-06-20 NOTE — ED Notes (Signed)
Ortho tech in room placing splint.
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
## 2014-06-21 MED ORDER — OXYCODONE-ACETAMINOPHEN 5-325 MG PO TABS: 1.0000 | ORAL_TABLET | Freq: Four times a day (QID) | ORAL | Status: DC | PRN

## 2014-06-21 NOTE — Discharge Instructions (Signed)
Cast Care The purpose of a cast is to protect and immobilize an injured part of the body. This may be necessary after fractures, surgery, or other injuries. Splints are another form of immobilization; however, they are usually not as rigid as a cast, which accommodates swelling of the injury while still maintaining immobilization. Splints are typically used in the immediate post injury or postoperative period, before changing to a cast.  Before the rigid material of a cast is formed, a layer of padding is first applied to protect the injury. The rigid portion of a cast is created by wrapping gauze saturated with plaster of paris around the injury; alternatively, the cast may be made of fiberglass. During the period of immobilization, a cast may need to be changed multiple times. The length of immobilization is dependent on the severity of the injury and the time needed for healing. This period can vary from a couple weeks to many months. After a cast is created, radiographs (X-rays) through the cast determine if a satisfactory alignment of the bones was achieved. Radiographs may also be used throughout the healing period to check for signs of bone healing.  CARE OF THE CAST   Allow the cast to dry and harden completely before applying any pressure to it.  Applying pressure too early may create a point of excessive pressure on the skin, which may increase the risk of forming an ulcer. Drying time depends on the type of cast but may last as long as 24 hours.  When a cast gets wet, a soft area may appear. If this happens accidentally, return to the doctor's office, emergency room, or outpatient surgical facility as soon as possible for repairs or changing of the cast.  Do not get sand in the cast.  Do not place anything inside the cast. This includes items intended to scratch an area of skin that itches. ITCHING INSIDE A CAST   It is very common for a person with a cast to experience an itching  sensation under the cast.  Do not scratch any area under the cast even if the area is within reach. The skin under a cast in an environment of increased risk for injury.  Do not put anything in the cast.  If there is no wound, such as an incision from surgery, you may sprinkle cornstarch into the cast to relieve itching.  If a wound is present under the cast, consult your caregiver for pain medication or medication to reduce itching.  Using a hairdryer (on the cold setting) is a useful technique for reducing an itching sensation. CARE OF THE PATIENT IN A CAST It is important to elevate the body part that is in a cast to a level equal to or above that of the heart whenever possible. Elevating the injured body part may reduce the likelihood of swelling. Elevation of a leg in a cast may be achieved by resting the leg on a pillow when in bed and on a footstool or chair when sitting. For an arm in a cast, rest the arm on a pillow placed on the chest.  No matter how well one follows the necessary precautions, excessive swelling may occur under the cast. Signs and symptoms of excessive swelling include:  Severe and persistent pain.  Change in color of the tissues beyond the end of the cast, such as a change to blue or gray under the nails of the fingers or toes.  Coldness of the tissues beyond the cast when   the rest of the body is warm.  Numbness or complete loss of feeling in the skin beyond the cast.  Feeling of tightness under the cast after it dries.  Swelling of the tissue to a greater extent than was present before the cast was applied.  For a leg cast, inability to raise the big toe. If any of these signs or symptoms occur, contact your caregiver or an emergency room as soon as possible for treatment.  Infection Inside a Cast On occasion, an injury may become infected during healing. The most important way to fight an infection is to detect it early; however, early detection may be  difficult if the infected area is covered by a cast. Infection should be reported immediately to your caregiver. The following are common signs and symptoms of infection:   Foul smell.  Fever greater than 101F (38.3C) (may be accompanied by a general ill feeling).  Leakage of fluid through the cast.  Increasing pain or soreness of the skin under the cast. Bathing with a Cast Bathing is often a difficult task with a cast. The cast must be kept dry at all times, unless otherwise specified by your caregiver. If the cast is on a limb, such as your arm or leg, it is often easier to take a bath with the extremity in a cast propped up on the side of the tub or a chair, out of the water. If the cast is on the trunk of the body, you should take sponge baths until the cast is removed.  Document Released: 11/03/2005 Document Revised: 03/20/2014 Document Reviewed: 02/15/2009 Exit[employer] Patient Information 2015 [employer]. This information is not intended to replace advice given to you by your health care provider. Make sure you discuss any questions you have with your health care provider.  

## 2014-09-18 ENCOUNTER — Encounter (HOSPITAL_COMMUNITY): Payer: Self-pay | Admitting: Emergency Medicine

## 2014-09-25 ENCOUNTER — Other Ambulatory Visit: Payer: Self-pay | Admitting: Nurse Practitioner

## 2014-09-25 ENCOUNTER — Other Ambulatory Visit (HOSPITAL_COMMUNITY)
Admission: RE | Admit: 2014-09-25 | Discharge: 2014-09-25 | Disposition: A | Discharge status: Home / Self Care | Payer: Federal, State, Local not specified - PPO | Source: Ambulatory Visit | Attending: Nurse Practitioner | Admitting: Nurse Practitioner

## 2014-09-25 DIAGNOSIS — Z113 Encounter for screening for infections with a predominantly sexual mode of transmission: Secondary | ICD-10-CM | POA: Diagnosis present

## 2014-09-25 DIAGNOSIS — R87810 Cervical high risk human papillomavirus (HPV) DNA test positive: Secondary | ICD-10-CM | POA: Insufficient documentation

## 2014-09-25 DIAGNOSIS — Z1151 Encounter for screening for human papillomavirus (HPV): Secondary | ICD-10-CM | POA: Diagnosis present

## 2014-09-25 DIAGNOSIS — Z01411 Encounter for gynecological examination (general) (routine) with abnormal findings: Secondary | ICD-10-CM | POA: Diagnosis not present

## 2014-09-28 LAB — CYTOLOGY - PAP

## 2014-11-17 NOTE — L&D Delivery Note (Signed)
Delivery Note  23yo [id number] @ 8744w5d who presented in latent labor.  Preg uncomplicated to date, prenatal care with Dr. Richardson Doppole.  Pt was augmented with Pitocin and AROM.  She progressed rapidly to complete at 2:42 AM a viable female was delivered via Vaginal, Spontaneous Delivery.  Presentation: vertex; Position: Left,, Occiput,, Anterior; Station: +3.  Delivery complicated by shoulder dystocia- see below  Delivery of the head: 10/04/2015  2:42 AM First maneuver: 10/04/2015  2:42 AM, McRoberts Second maneuver: 10/04/2015  2:42 AM, Donnie Coffinubin (fetal shoulder adduction) Third maneuver: 10/04/2015  2:42 AM,  Posterior arm APGAR: 8, 9; weight 7 lb 15.7 oz (3620 g).   Placenta status: Intact, Spontaneous.   Cord: 3 vessels with the following complications: None.    Anesthesia: None  Episiotomy: None Lacerations: None Suture Repair: N/A Est. Blood Loss (mL): 200  Mom to postpartum.  Baby to Couplet care / Skin to Skin.  Myna HidalgoZAN, Nori Poland, M 10/04/2015, 3:28 AM

## 2015-02-20 LAB — OB RESULTS CONSOLE RUBELLA ANTIBODY, IGM: RUBELLA: IMMUNE

## 2015-02-20 LAB — OB RESULTS CONSOLE HIV ANTIBODY (ROUTINE TESTING): HIV: NONREACTIVE

## 2015-02-20 LAB — OB RESULTS CONSOLE ABO/RH: RH Type: POSITIVE

## 2015-02-20 LAB — OB RESULTS CONSOLE ANTIBODY SCREEN: Antibody Screen: NEGATIVE

## 2015-03-19 LAB — OB RESULTS CONSOLE GC/CHLAMYDIA
CHLAMYDIA, DNA PROBE: NEGATIVE
GC PROBE AMP, GENITAL: NEGATIVE

## 2015-03-20 ENCOUNTER — Other Ambulatory Visit (HOSPITAL_COMMUNITY)
Admission: RE | Admit: 2015-03-20 | Discharge: 2015-03-20 | Disposition: A | Discharge status: Home / Self Care | Payer: BLUE CROSS/BLUE SHIELD | Source: Ambulatory Visit | Attending: Obstetrics and Gynecology | Admitting: Obstetrics and Gynecology

## 2015-03-20 DIAGNOSIS — R87810 Cervical high risk human papillomavirus (HPV) DNA test positive: Secondary | ICD-10-CM | POA: Insufficient documentation

## 2015-04-03 ENCOUNTER — Telehealth: Payer: Self-pay | Admitting: Obstetrics and Gynecology

## 2015-04-03 NOTE — Telephone Encounter (Deleted)
Incorrect patient

## 2015-04-03 NOTE — Telephone Encounter (Signed)
TC from pt on 04/01/15 at 20:42 PM due to cramping x 1 hour, to the point where it hurts to walk and "straighten up". States currently [redacted] wks gestation. No treatments tried. No recent strenuous activity or intercourse. Pt is a G3, pt of Dr. Richardson Doppole. She reports +FM. Denies bleeding or unusual d/c/LOF. Reviewed common discomforts of pregnancy for this GA, along with their relief measures - likely round ligament pain. Advised Tylenol and implementation of the comfort measures now. Advised to monitor over next hour and 1) call back, or 2) come to MAU with worsening pain. Pt had no further questions or concerns. SAB precautions.

## 2015-04-17 ENCOUNTER — Other Ambulatory Visit: Payer: Self-pay | Admitting: Obstetrics and Gynecology

## 2015-04-17 ENCOUNTER — Other Ambulatory Visit (HOSPITAL_COMMUNITY)
Admission: RE | Admit: 2015-04-17 | Discharge: 2015-04-17 | Disposition: A | Discharge status: Home / Self Care | Payer: BLUE CROSS/BLUE SHIELD | Source: Ambulatory Visit | Attending: Obstetrics and Gynecology | Admitting: Obstetrics and Gynecology

## 2015-04-17 DIAGNOSIS — Z1151 Encounter for screening for human papillomavirus (HPV): Secondary | ICD-10-CM | POA: Diagnosis present

## 2015-04-17 DIAGNOSIS — R87810 Cervical high risk human papillomavirus (HPV) DNA test positive: Secondary | ICD-10-CM | POA: Insufficient documentation

## 2015-04-17 DIAGNOSIS — Z01411 Encounter for gynecological examination (general) (routine) with abnormal findings: Secondary | ICD-10-CM | POA: Diagnosis present

## 2015-04-18 LAB — CYTOLOGY - PAP

## 2015-07-18 LAB — OB RESULTS CONSOLE RPR: RPR: NONREACTIVE

## 2015-07-18 LAB — OB RESULTS CONSOLE HEPATITIS B SURFACE ANTIGEN: Hepatitis B Surface Ag: NEGATIVE

## 2015-09-11 LAB — OB RESULTS CONSOLE GBS: GBS: NEGATIVE

## 2015-10-03 ENCOUNTER — Inpatient Hospital Stay (HOSPITAL_COMMUNITY)
Admission: AD | Admit: 2015-10-03 | Discharge: 2015-10-06 | DRG: 767 | Disposition: A | Discharge status: Home / Self Care | Payer: Medicaid Other | Source: Ambulatory Visit | Attending: Obstetrics & Gynecology | Admitting: Obstetrics & Gynecology

## 2015-10-03 ENCOUNTER — Encounter (HOSPITAL_COMMUNITY): Payer: Self-pay | Admitting: *Deleted

## 2015-10-03 DIAGNOSIS — O99345 Other mental disorders complicating the puerperium: Secondary | ICD-10-CM | POA: Diagnosis not present

## 2015-10-03 DIAGNOSIS — F53 Puerperal psychosis: Secondary | ICD-10-CM | POA: Diagnosis not present

## 2015-10-03 DIAGNOSIS — F419 Anxiety disorder, unspecified: Secondary | ICD-10-CM | POA: Diagnosis not present

## 2015-10-03 DIAGNOSIS — N858 Other specified noninflammatory disorders of uterus: Secondary | ICD-10-CM | POA: Diagnosis present

## 2015-10-03 DIAGNOSIS — Z3A39 39 weeks gestation of pregnancy: Secondary | ICD-10-CM | POA: Diagnosis not present

## 2015-10-03 DIAGNOSIS — Z3490 Encounter for supervision of normal pregnancy, unspecified, unspecified trimester: Secondary | ICD-10-CM

## 2015-10-03 DIAGNOSIS — S3763XA Laceration of uterus, initial encounter: Secondary | ICD-10-CM | POA: Diagnosis present

## 2015-10-03 DIAGNOSIS — R58 Hemorrhage, not elsewhere classified: Secondary | ICD-10-CM

## 2015-10-03 LAB — CBC
HCT: 32.8 % — ABNORMAL LOW (ref 36.0–46.0)
HEMOGLOBIN: 10.8 g/dL — AB (ref 12.0–15.0)
MCH: 31.9 pg (ref 26.0–34.0)
MCHC: 32.9 g/dL (ref 30.0–36.0)
MCV: 96.8 fL (ref 78.0–100.0)
Platelets: 214 10*3/uL (ref 150–400)
RBC: 3.39 MIL/uL — ABNORMAL LOW (ref 3.87–5.11)
RDW: 12.9 % (ref 11.5–15.5)
WBC: 12.3 10*3/uL — ABNORMAL HIGH (ref 4.0–10.5)

## 2015-10-03 MED ORDER — OXYTOCIN BOLUS FROM INFUSION: 500.0000 mL | INTRAVENOUS | Status: DC

## 2015-10-03 MED ORDER — BUTORPHANOL TARTRATE 1 MG/ML IJ SOLN
1.0000 mg | INTRAMUSCULAR | Status: DC | PRN
  Administered 2015-10-04: 1 mg via INTRAVENOUS
  Filled 2015-10-03: qty 1

## 2015-10-03 MED ORDER — LIDOCAINE HCL (PF) 1 % IJ SOLN
30.0000 mL | INTRAMUSCULAR | Status: DC | PRN
  Filled 2015-10-03: qty 30

## 2015-10-03 MED ORDER — OXYCODONE-ACETAMINOPHEN 5-325 MG PO TABS: 2.0000 | ORAL_TABLET | ORAL | Status: DC | PRN

## 2015-10-03 MED ORDER — OXYTOCIN 40 UNITS IN LACTATED RINGERS INFUSION - SIMPLE MED
62.5000 mL/h | INTRAVENOUS | Status: DC
  Administered 2015-10-04 (×2): 62.5 mL/h via INTRAVENOUS

## 2015-10-03 MED ORDER — LACTATED RINGERS IV SOLN: 500.0000 mL | INTRAVENOUS | Status: DC | PRN

## 2015-10-03 MED ORDER — CITRIC ACID-SODIUM CITRATE 334-500 MG/5ML PO SOLN: 30.0000 mL | ORAL | Status: DC | PRN

## 2015-10-03 MED ORDER — ACETAMINOPHEN 325 MG PO TABS: 650.0000 mg | ORAL_TABLET | ORAL | Status: DC | PRN

## 2015-10-03 MED ORDER — OXYCODONE-ACETAMINOPHEN 5-325 MG PO TABS
1.0000 | ORAL_TABLET | ORAL | Status: DC | PRN
Start: 2015-10-03 — End: 2015-10-04

## 2015-10-03 MED ORDER — ONDANSETRON HCL 4 MG/2ML IJ SOLN: 4.0000 mg | Freq: Four times a day (QID) | INTRAMUSCULAR | Status: DC | PRN

## 2015-10-03 MED ORDER — LACTATED RINGERS IV SOLN
INTRAVENOUS | Status: DC
  Administered 2015-10-03: 21:00:00 via INTRAVENOUS

## 2015-10-03 MED ORDER — OXYTOCIN 40 UNITS IN LACTATED RINGERS INFUSION - SIMPLE MED
1.0000 m[IU]/min | INTRAVENOUS | Status: DC
  Administered 2015-10-03: 2 m[IU]/min via INTRAVENOUS
  Administered 2015-10-03: 1 m[IU]/min via INTRAVENOUS
  Filled 2015-10-03: qty 1000

## 2015-10-03 MED ORDER — TERBUTALINE SULFATE 1 MG/ML IJ SOLN: 0.2500 mg | Freq: Once | INTRAMUSCULAR | Status: DC | PRN

## 2015-10-03 NOTE — Progress Notes (Signed)
OB PN:  S: Pt reports contractions are about the same  O: BP 103/60 mmHg  Pulse 87  Temp(Src) 99 F (37.2 C) (Oral)  Resp 20  Ht 5\' 4"  (1.626 m)  Wt 65.772 kg (145 lb)  BMI 24.88 kg/m2  LMP 12/29/2014  FHT: 130bpm, moderate variablity, + accels, no decels Toco: q793min SVE: 3/70/-2, vertex, AROM clear fluid  A/P: 24 y.o. [id number] @ 3063w5d in latent labor 1. FWB: Cat. I 2. Labor: continue Pit per protocol, AROM clear fluid Pain: IV pain medication prn GBS: negative  Myna HidalgoJennifer Antoino Westhoff, DO [phone]) [phone] (office)

## 2015-10-03 NOTE — MAU Note (Signed)
Contracting since 1500 today. States contractions are 3-747mins. Was seen in office yesterday and was closed on exam. Next appointment is scheduled for Monday. Sees Dr. Richardson Doppole. Pt. States baby is moving well. Denies LOF or bleeding.

## 2015-10-03 NOTE — H&P (Signed)
HPI: 24 y/o G3P2002 @ 4530w5d estimated gestational age (as dated by LMP c/w 20 week ultrasound) presents complaining of worsening contractions.  Pt was seen in the office and noted to be closed; however, she notes considerable pelvic pressure. no Leaking of Fluid,   Slight bloody  + Fetal Movement.  ROS: no HA, no epigastric pain, no visual changes.    Pregnancy uncomplicated   Prenatal Transfer Tool  Maternal Diabetes: No Genetic Screening: Normal Maternal Ultrasounds/Referrals: Normal Fetal Ultrasounds or other Referrals:  None Maternal Substance Abuse:  No Significant Maternal Medications:  None Significant Maternal Lab Results: Lab values include: Group B Strep negative   PNL:  GBS negative, Rub Immune, Hep B neg, RPR NR, HIV neg, GC/C neg, glucola:95 Hgb: 11.4 (07/18/15) Blood type: O positive  OBHx: FTNSVD x 2, 6# 11oz- 8#10oz -last delivery in 4hrs PMHx:  none Meds:  PNV Allergy:  No Known Allergies SurgHx: none SocHx:   no Tobacco, no  EtOH, no Illicit Drugs  O: LMP 12/29/2014 BP 118/64 mmHg  Temp(Src) 98.9 F (37.2 C) (Oral)  Resp 20  LMP 12/29/2014  Gen. AAOx3, NAD CV.  RRR  No murmur.  Resp. CTAB, no wheeze or crackles. Abd. Gravid,  no tenderness,  no rigidity,  no guarding Extr.  no edema B/L , no calf tenderness FHT: 140 baseline, moderate variability, + accels,  no decels Toco: irregular SVE:  Cervical change noted: 2/thick/-3 (per RN), vertex by exam  Labs: see orders  A/P:  24 y.o. [id number] @ 630w5d EGA who presents for latent labor -FWB:  NICHD Cat I FHTs -Labor: pt has h/o precipitous delivery, plan for expectant management.  Will augment with Pitocin if no further cervical change noted or contractions space out -GBS: negative -Pain management: IV medication prn  Myna HidalgoJennifer Tove Wideman, DO [phone] (pager) [phone] (office)

## 2015-10-03 NOTE — MAU Note (Signed)
Pt C/O uc's today that have become closer & more intense. Denies bleeding or LOF.  Was checked @ MD office yesterday, closed.

## 2015-10-03 NOTE — MAU Note (Signed)
Pt. Back from walking. Feeling pressure. EFM applied.

## 2015-10-03 NOTE — MAU Note (Signed)
Pt. Agreeable to POC. OOB to walk for 2 hours and then be rechecked at that time.

## 2015-10-03 NOTE — Progress Notes (Signed)
OB PN:  S: Pt reports contractions are about the same  O: BP 118/64 mmHg  Temp(Src) 98.9 F (37.2 C) (Oral)  Resp 20  Ht 5\' 4"  (1.626 m)  Wt 65.772 kg (145 lb)  BMI 24.88 kg/m2  LMP 12/29/2014  FHT: 145bpm, moderate variablity, + accels, no decels Toco: irregular SVE: 3/70/-2, vertex  A/P: 24 y.o. [id number] @ 5750w5d in latent labor 1. FWB: Cat. I 2. Labor: plan for augmentation with Pit Pain: IV pain medication prn GBS: negative  Myna HidalgoJennifer Aniello Christopoulos, DO [phone] (pager) [phone] (office)

## 2015-10-03 NOTE — MAU Note (Signed)
Aleta called with report. Room 167 given for admission.

## 2015-10-04 ENCOUNTER — Encounter (HOSPITAL_COMMUNITY): Payer: Self-pay

## 2015-10-04 ENCOUNTER — Inpatient Hospital Stay (HOSPITAL_COMMUNITY): Payer: Medicaid Other | Admitting: Anesthesiology

## 2015-10-04 ENCOUNTER — Inpatient Hospital Stay (HOSPITAL_COMMUNITY): Payer: Medicaid Other

## 2015-10-04 ENCOUNTER — Encounter (HOSPITAL_COMMUNITY)
Admission: AD | Disposition: A | Discharge status: Home / Self Care | Payer: Self-pay | Source: Ambulatory Visit | Attending: Obstetrics & Gynecology

## 2015-10-04 DIAGNOSIS — S3763XA Laceration of uterus, initial encounter: Secondary | ICD-10-CM | POA: Diagnosis present

## 2015-10-04 HISTORY — PX: DILATION AND EVACUATION: SHX1459

## 2015-10-04 LAB — RPR: RPR Ser Ql: NONREACTIVE

## 2015-10-04 LAB — DIC (DISSEMINATED INTRAVASCULAR COAGULATION)PANEL
D-Dimer, Quant: 2.66 ug/mL-FEU — ABNORMAL HIGH (ref 0.00–0.50)
Fibrinogen: 299 mg/dL (ref 204–475)
Prothrombin Time: 15.4 seconds — ABNORMAL HIGH (ref 11.6–15.2)
Smear Review: NONE SEEN

## 2015-10-04 LAB — CBC
HCT: 24 % — ABNORMAL LOW (ref 36.0–46.0)
Hemoglobin: 8.1 g/dL — ABNORMAL LOW (ref 12.0–15.0)
MCH: 32.9 pg (ref 26.0–34.0)
MCHC: 33.8 g/dL (ref 30.0–36.0)
MCV: 97.6 fL (ref 78.0–100.0)
PLATELETS: 201 10*3/uL (ref 150–400)
RBC: 2.46 MIL/uL — AB (ref 3.87–5.11)
RDW: 13.1 % (ref 11.5–15.5)
WBC: 25.4 10*3/uL — ABNORMAL HIGH (ref 4.0–10.5)

## 2015-10-04 LAB — MASSIVE TRANSFUSION PROTOCOL ORDER (BLOOD BANK NOTIFICATION)

## 2015-10-04 LAB — POSTPARTUM HEMORRHAGE PROTOCOL (BB NOTIFICATION)

## 2015-10-04 LAB — PROTIME-INR
INR: 1.22 (ref 0.00–1.49)
PROTHROMBIN TIME: 15.6 s — AB (ref 11.6–15.2)

## 2015-10-04 LAB — HEMOGLOBIN AND HEMATOCRIT, BLOOD
HCT: 23 % — ABNORMAL LOW (ref 36.0–46.0)
Hemoglobin: 7.9 g/dL — ABNORMAL LOW (ref 12.0–15.0)

## 2015-10-04 LAB — DIC (DISSEMINATED INTRAVASCULAR COAGULATION) PANEL
APTT: 27 s (ref 24–37)
INR: 1.2 (ref 0.00–1.49)
PLATELETS: 227 10*3/uL (ref 150–400)

## 2015-10-04 LAB — FIBRINOGEN: FIBRINOGEN: 266 mg/dL (ref 204–475)

## 2015-10-04 LAB — APTT: aPTT: 30 seconds (ref 24–37)

## 2015-10-04 SURGERY — DILATION AND EVACUATION, UTERUS
Anesthesia: General | Site: Vagina

## 2015-10-04 MED ORDER — CEFAZOLIN SODIUM-DEXTROSE 2-3 GM-% IV SOLR
INTRAVENOUS | Status: DC | PRN
  Administered 2015-10-04: 2 g via INTRAVENOUS

## 2015-10-04 MED ORDER — PHENYLEPHRINE 8 MG IN D5W 100 ML (0.08MG/ML) PREMIX OPTIME
INJECTION | INTRAVENOUS | Status: AC
  Filled 2015-10-04: qty 100

## 2015-10-04 MED ORDER — SUCCINYLCHOLINE CHLORIDE 20 MG/ML IJ SOLN
INTRAMUSCULAR | Status: AC
  Filled 2015-10-04: qty 1

## 2015-10-04 MED ORDER — DIPHENHYDRAMINE HCL 25 MG PO CAPS: 25.0000 mg | ORAL_CAPSULE | Freq: Four times a day (QID) | ORAL | Status: DC | PRN

## 2015-10-04 MED ORDER — SUCCINYLCHOLINE CHLORIDE 20 MG/ML IJ SOLN
INTRAMUSCULAR | Status: DC | PRN
  Administered 2015-10-04: 120 mg via INTRAVENOUS

## 2015-10-04 MED ORDER — METHYLERGONOVINE MALEATE 0.2 MG/ML IJ SOLN
INTRAMUSCULAR | Status: DC | PRN
  Administered 2015-10-04: 0.2 mg via INTRAMUSCULAR

## 2015-10-04 MED ORDER — PHENYLEPHRINE 40 MCG/ML (10ML) SYRINGE FOR IV PUSH (FOR BLOOD PRESSURE SUPPORT)
PREFILLED_SYRINGE | INTRAVENOUS | Status: AC
  Filled 2015-10-04: qty 30

## 2015-10-04 MED ORDER — LIDOCAINE HCL (CARDIAC) 20 MG/ML IV SOLN
INTRAVENOUS | Status: AC
  Filled 2015-10-04: qty 5

## 2015-10-04 MED ORDER — MEDROXYPROGESTERONE ACETATE 150 MG/ML IM SUSP
150.0000 mg | Freq: Once | INTRAMUSCULAR | Status: AC
  Administered 2015-10-06: 150 mg via INTRAMUSCULAR
  Filled 2015-10-04 (×2): qty 1

## 2015-10-04 MED ORDER — LIDOCAINE HCL (CARDIAC) 20 MG/ML IV SOLN
INTRAVENOUS | Status: DC | PRN
  Administered 2015-10-04: 50 mg via INTRAVENOUS

## 2015-10-04 MED ORDER — SIMETHICONE 80 MG PO CHEW: 80.0000 mg | CHEWABLE_TABLET | ORAL | Status: DC | PRN

## 2015-10-04 MED ORDER — SERTRALINE HCL 25 MG PO TABS
25.0000 mg | ORAL_TABLET | Freq: Every day | ORAL | Status: DC
  Administered 2015-10-05 – 2015-10-06 (×2): 25 mg via ORAL
  Filled 2015-10-04 (×2): qty 1

## 2015-10-04 MED ORDER — SODIUM CHLORIDE 0.9 % IV SOLN
INTRAVENOUS | Status: DC | PRN
  Administered 2015-10-04 (×2): via INTRAVENOUS

## 2015-10-04 MED ORDER — METHYLERGONOVINE MALEATE 0.2 MG PO TABS
0.2000 mg | ORAL_TABLET | Freq: Four times a day (QID) | ORAL | Status: DC
Start: 2015-10-04 — End: 2015-10-05
  Administered 2015-10-04: 0.2 mg via ORAL
  Filled 2015-10-04: qty 1

## 2015-10-04 MED ORDER — SENNOSIDES-DOCUSATE SODIUM 8.6-50 MG PO TABS
2.0000 | ORAL_TABLET | ORAL | Status: DC
Start: 2015-10-05 — End: 2015-10-06
  Filled 2015-10-04 (×2): qty 2

## 2015-10-04 MED ORDER — FENTANYL CITRATE (PF) 100 MCG/2ML IJ SOLN
INTRAMUSCULAR | Status: DC | PRN
  Administered 2015-10-04: 50 ug via INTRAVENOUS

## 2015-10-04 MED ORDER — CITRIC ACID-SODIUM CITRATE 334-500 MG/5ML PO SOLN
ORAL | Status: AC
  Filled 2015-10-04: qty 15

## 2015-10-04 MED ORDER — CEFAZOLIN SODIUM 1-5 GM-% IV SOLN
1.0000 g | Freq: Three times a day (TID) | INTRAVENOUS | Status: DC
  Administered 2015-10-04 – 2015-10-05 (×2): 1 g via INTRAVENOUS
  Filled 2015-10-04 (×3): qty 50

## 2015-10-04 MED ORDER — OXYCODONE-ACETAMINOPHEN 5-325 MG PO TABS: 2.0000 | ORAL_TABLET | ORAL | Status: DC | PRN

## 2015-10-04 MED ORDER — ZOLPIDEM TARTRATE 5 MG PO TABS: 5.0000 mg | ORAL_TABLET | Freq: Every evening | ORAL | Status: DC | PRN

## 2015-10-04 MED ORDER — FENTANYL CITRATE (PF) 100 MCG/2ML IJ SOLN
INTRAMUSCULAR | Status: AC
  Filled 2015-10-04: qty 2

## 2015-10-04 MED ORDER — OXYTOCIN 40 UNITS IN LACTATED RINGERS INFUSION - SIMPLE MED
INTRAVENOUS | Status: AC
  Administered 2015-10-04: 62.5 mL/h via INTRAVENOUS
  Filled 2015-10-04: qty 1000

## 2015-10-04 MED ORDER — ONDANSETRON HCL 4 MG/2ML IJ SOLN
INTRAMUSCULAR | Status: DC | PRN
  Administered 2015-10-04: 4 mg via INTRAVENOUS

## 2015-10-04 MED ORDER — PROPOFOL 10 MG/ML IV BOLUS
INTRAVENOUS | Status: DC | PRN
  Administered 2015-10-04: 150 mg via INTRAVENOUS

## 2015-10-04 MED ORDER — FENTANYL CITRATE (PF) 100 MCG/2ML IJ SOLN
25.0000 ug | INTRAMUSCULAR | Status: DC | PRN
  Administered 2015-10-04: 25 ug via INTRAVENOUS

## 2015-10-04 MED ORDER — LACTATED RINGERS IV SOLN
INTRAVENOUS | Status: DC
  Administered 2015-10-04 – 2015-10-05 (×6): via INTRAVENOUS

## 2015-10-04 MED ORDER — PROPOFOL 10 MG/ML IV BOLUS
INTRAVENOUS | Status: AC
  Filled 2015-10-04: qty 20

## 2015-10-04 MED ORDER — PHENYLEPHRINE 40 MCG/ML (10ML) SYRINGE FOR IV PUSH (FOR BLOOD PRESSURE SUPPORT)
PREFILLED_SYRINGE | INTRAVENOUS | Status: AC
  Filled 2015-10-04: qty 20

## 2015-10-04 MED ORDER — CEFAZOLIN SODIUM-DEXTROSE 2-3 GM-% IV SOLR
INTRAVENOUS | Status: AC
  Filled 2015-10-04: qty 50

## 2015-10-04 MED ORDER — ONDANSETRON HCL 4 MG/2ML IJ SOLN: 4.0000 mg | INTRAMUSCULAR | Status: DC | PRN

## 2015-10-04 MED ORDER — MISOPROSTOL 200 MCG PO TABS
ORAL_TABLET | ORAL | Status: AC
  Administered 2015-10-04: 800 ug via ORAL
  Filled 2015-10-04: qty 4

## 2015-10-04 MED ORDER — CEFAZOLIN SODIUM 1-5 GM-% IV SOLN
1.0000 g | Freq: Three times a day (TID) | INTRAVENOUS | Status: DC
  Filled 2015-10-04 (×3): qty 50

## 2015-10-04 MED ORDER — SODIUM CHLORIDE 0.9 % IJ SOLN
INTRAMUSCULAR | Status: AC
  Filled 2015-10-04: qty 6

## 2015-10-04 MED ORDER — MISOPROSTOL 200 MCG PO TABS
800.0000 ug | ORAL_TABLET | Freq: Once | ORAL | Status: AC
  Administered 2015-10-04: 800 ug via ORAL

## 2015-10-04 MED ORDER — CARBOPROST TROMETHAMINE 250 MCG/ML IM SOLN
INTRAMUSCULAR | Status: DC | PRN
  Administered 2015-10-04: 250 ug via INTRAMUSCULAR

## 2015-10-04 MED ORDER — PHENYLEPHRINE HCL 10 MG/ML IJ SOLN
INTRAMUSCULAR | Status: DC | PRN
  Administered 2015-10-04 (×10): 80 ug via INTRAVENOUS

## 2015-10-04 MED ORDER — LANOLIN HYDROUS EX OINT: TOPICAL_OINTMENT | CUTANEOUS | Status: DC | PRN

## 2015-10-04 MED ORDER — PHENYLEPHRINE 8 MG IN D5W 100 ML (0.08MG/ML) PREMIX OPTIME
INJECTION | INTRAVENOUS | Status: DC | PRN
  Administered 2015-10-04: 60 ug/min via INTRAVENOUS

## 2015-10-04 MED ORDER — MORPHINE SULFATE (PF) 0.5 MG/ML IJ SOLN
INTRAMUSCULAR | Status: AC
  Filled 2015-10-04: qty 10

## 2015-10-04 MED ORDER — BENZOCAINE-MENTHOL 20-0.5 % EX AERO: 1.0000 "application " | INHALATION_SPRAY | CUTANEOUS | Status: DC | PRN

## 2015-10-04 MED ORDER — CITRIC ACID-SODIUM CITRATE 334-500 MG/5ML PO SOLN
30.0000 mL | Freq: Once | ORAL | Status: AC
  Administered 2015-10-04: 30 mL via ORAL

## 2015-10-04 MED ORDER — WITCH HAZEL-GLYCERIN EX PADS: 1.0000 "application " | MEDICATED_PAD | CUTANEOUS | Status: DC | PRN

## 2015-10-04 MED ORDER — MIDAZOLAM HCL 2 MG/2ML IJ SOLN
INTRAMUSCULAR | Status: AC
  Filled 2015-10-04: qty 2

## 2015-10-04 MED ORDER — MIDAZOLAM HCL 2 MG/2ML IJ SOLN
INTRAMUSCULAR | Status: DC | PRN
  Administered 2015-10-04 (×2): 1 mg via INTRAVENOUS

## 2015-10-04 MED ORDER — IBUPROFEN 600 MG PO TABS
600.0000 mg | ORAL_TABLET | Freq: Four times a day (QID) | ORAL | Status: DC
  Administered 2015-10-04 – 2015-10-06 (×10): 600 mg via ORAL
  Filled 2015-10-04 (×10): qty 1

## 2015-10-04 MED ORDER — HYDROMORPHONE HCL 1 MG/ML IJ SOLN
INTRAMUSCULAR | Status: AC
  Administered 2015-10-04: 1 mg via INTRAVENOUS
  Filled 2015-10-04: qty 1

## 2015-10-04 MED ORDER — ONDANSETRON HCL 4 MG PO TABS: 4.0000 mg | ORAL_TABLET | ORAL | Status: DC | PRN

## 2015-10-04 MED ORDER — PRENATAL MULTIVITAMIN CH
1.0000 | ORAL_TABLET | Freq: Every day | ORAL | Status: DC
  Administered 2015-10-05 – 2015-10-06 (×2): 1 via ORAL
  Filled 2015-10-04 (×2): qty 1

## 2015-10-04 MED ORDER — ACETAMINOPHEN 325 MG PO TABS: 650.0000 mg | ORAL_TABLET | ORAL | Status: DC | PRN

## 2015-10-04 MED ORDER — DIBUCAINE 1 % RE OINT
1.0000 | TOPICAL_OINTMENT | RECTAL | Status: DC | PRN
Start: 2015-10-04 — End: 2015-10-06

## 2015-10-04 MED ORDER — PHENYLEPHRINE 40 MCG/ML (10ML) SYRINGE FOR IV PUSH (FOR BLOOD PRESSURE SUPPORT)
PREFILLED_SYRINGE | INTRAVENOUS | Status: AC
  Administered 2015-10-04: 80 ug
  Filled 2015-10-04: qty 40

## 2015-10-04 MED ORDER — OXYCODONE-ACETAMINOPHEN 5-325 MG PO TABS
1.0000 | ORAL_TABLET | ORAL | Status: DC | PRN
  Administered 2015-10-04: 1 via ORAL
  Filled 2015-10-04: qty 1

## 2015-10-04 MED ORDER — PHENYLEPHRINE 200 MCG/ML (NON-ED) FOR PRIAPISM
80.0000 ug | Freq: Once | INTRAMUSCULAR | Status: DC
  Filled 2015-10-04: qty 10

## 2015-10-04 MED ORDER — ONDANSETRON HCL 4 MG/2ML IJ SOLN
INTRAMUSCULAR | Status: AC
  Filled 2015-10-04: qty 2

## 2015-10-04 MED ORDER — LIDOCAINE-EPINEPHRINE 1 %-1:100000 IJ SOLN
INTRAMUSCULAR | Status: AC
  Filled 2015-10-04: qty 1

## 2015-10-04 MED ORDER — PHENYLEPHRINE 40 MCG/ML (10ML) SYRINGE FOR IV PUSH (FOR BLOOD PRESSURE SUPPORT)
PREFILLED_SYRINGE | INTRAVENOUS | Status: AC
  Filled 2015-10-04: qty 10

## 2015-10-04 MED ORDER — HYDROMORPHONE HCL 1 MG/ML IJ SOLN
1.0000 mg | Freq: Once | INTRAMUSCULAR | Status: AC
  Administered 2015-10-04: 1 mg via INTRAVENOUS

## 2015-10-04 MED ORDER — ONDANSETRON HCL 4 MG/2ML IJ SOLN: 4.0000 mg | Freq: Once | INTRAMUSCULAR | Status: DC | PRN

## 2015-10-04 SURGICAL SUPPLY — 21 items
CATH ROBINSON RED A/P 16FR (CATHETERS) ×3 IMPLANT
CLOTH BEACON ORANGE TIMEOUT ST (SAFETY) ×3 IMPLANT
DECANTER SPIKE VIAL GLASS SM (MISCELLANEOUS) ×3 IMPLANT
GLOVE BIO SURGEON STRL SZ 6.5 (GLOVE) ×4 IMPLANT
GLOVE BIO SURGEONS STRL SZ 6.5 (GLOVE) ×2
GLOVE BIOGEL PI IND STRL 6.5 (GLOVE) ×1 IMPLANT
GLOVE BIOGEL PI IND STRL 7.0 (GLOVE) ×1 IMPLANT
GLOVE BIOGEL PI INDICATOR 6.5 (GLOVE) ×2
GLOVE BIOGEL PI INDICATOR 7.0 (GLOVE) ×2
GOWN STRL REUS W/TWL LRG LVL3 (GOWN DISPOSABLE) ×6 IMPLANT
KIT BERKELEY 1ST TRIMESTER 3/8 (MISCELLANEOUS) ×3 IMPLANT
NS IRRIG 1000ML POUR BTL (IV SOLUTION) ×3 IMPLANT
PACK VAGINAL MINOR WOMEN LF (CUSTOM PROCEDURE TRAY) ×3 IMPLANT
PAD OB MATERNITY 4.3X12.25 (PERSONAL CARE ITEMS) ×3 IMPLANT
PAD PREP 24X48 CUFFED NSTRL (MISCELLANEOUS) ×3 IMPLANT
SET BERKELEY SUCTION TUBING (SUCTIONS) ×3 IMPLANT
TOWEL OR 17X24 6PK STRL BLUE (TOWEL DISPOSABLE) ×6 IMPLANT
VACURETTE 10 RIGID CVD (CANNULA) IMPLANT
VACURETTE 7MM CVD STRL WRAP (CANNULA) IMPLANT
VACURETTE 8 RIGID CVD (CANNULA) IMPLANT
VACURETTE 9 RIGID CVD (CANNULA) IMPLANT

## 2015-10-04 NOTE — Progress Notes (Signed)
RT called to OR 2 for Code MTP. RT was available until dismissed by OR to leave.

## 2015-10-04 NOTE — Lactation Note (Signed)
This note was copied from the chart of Girl Gemma PayorAlexandria Jillson. Lactation Consultation Note  Patient Name: Girl Gemma Payorlexandria Kishbaugh Today's Date: 10/04/2015 Reason for consult: Initial assessment Baby 12 hours old. Mom had PPH with greater than 1500 EBL. Nurse technician giving baby a bottle, per mom's request, when this LC entered patient's room. Mom states that she is still very tired, and has not been able to eat yet. So, mom has decided to let baby be supplemented with formula. Mom reports that she was able to nurse baby earlier, and believes baby nursed well. Mom states that she nursed her second child for 6 weeks, but did have issues with nipple damage/soreness. Enc mom to rest and attempt to nurse later as feeling better. Enc mom to ask to be set up with DEBP as soon as she is feeling up to it since baby not at breast. Mom not up to additional teaching/assistance at this time.   Maternal Data    Feeding Feeding Type: Formula  LATCH Score/Interventions                      Lactation Tools Discussed/Used     Consult Status Consult Status: Follow-up Date: 10/05/15 Follow-up type: In-patient    Geralynn OchsWILLIARD, Cezar Misiaszek 10/04/2015, 2:47 PM

## 2015-10-04 NOTE — Anesthesia Procedure Notes (Signed)
Procedure Name: Intubation Date/Time: 10/04/2015 7:24 AM Performed by: Earmon PhoenixWILKERSON, Lilie Vezina P Pre-anesthesia Checklist: Patient identified, Emergency Drugs available, Suction available, Patient being monitored and Timeout performed Patient Re-evaluated:Patient Re-evaluated prior to inductionOxygen Delivery Method: Circle system utilized Preoxygenation: Pre-oxygenation with 100% oxygen Intubation Type: IV induction Laryngoscope Size: Mac and 3 Grade View: Grade I Tube size: 7.0 mm Number of attempts: 1 Airway Equipment and Method: Stylet Placement Confirmation: ETT inserted through vocal cords under direct vision,  positive ETCO2,  CO2 detector and breath sounds checked- equal and bilateral Secured at: 20 cm Tube secured with: Tape Dental Injury: Teeth and Oropharynx as per pre-operative assessment

## 2015-10-04 NOTE — Progress Notes (Signed)
Pt. To OR via E.Foley RN, NeurosurgeonC.Neomia Herbel RN.

## 2015-10-04 NOTE — OR Nursing (Signed)
Dr. Richardson Doppole called to OR 2 by Donne Hazelhassity Stewart, RN at [phone]. MTP called to OR room 2 @ 0746 per Dr.Judd. Called [phone] and to lab at [phone]. Orders entered into computer by Donne Hazelhassity Stewart, RN @ [phone]. Ultrasound called to the OR at 0747 by Donne Hazelhassity Stewart, RN. Mliss Fritzavid Riggins, anesthesia tech transporting blood and blood products to OR for anesthesia team.

## 2015-10-04 NOTE — Transfer of Care (Signed)
Immediate Anesthesia Transfer of Care Note  Patient: [name]  Procedure(s) Performed: Procedure(s): DILATATION AND EVACUATION (N/A)  Patient Location: PACU  Anesthesia Type:General  Level of Consciousness: awake, alert , oriented and patient cooperative  Airway & Oxygen Therapy: Patient Spontanous Breathing and Patient connected to nasal cannula oxygen  Post-op Assessment: Report given to RN and Post -op Vital signs reviewed and stable  Post vital signs: Reviewed and stable  Last Vitals:  Filed Vitals:   10/04/15 0900  BP: 105/61  Pulse: 90  Temp:   Resp: 18    Complications: No apparent anesthesia complications

## 2015-10-04 NOTE — Op Note (Signed)
Preoperative diagnosis: PPH, Concern for retained products of conception  Postoperative diagnosis: PPH, Retained products of conceptions, cervical laceration  Anesthesia: general  Anesthesiologist: Dr. Gentry RochJudd  Procedure: Exam under anesthesia, Dilation and curettage under ultrasound guidance, repair of cervical laceration  Surgeon: Dr. Myna HidalgoJennifer Sacred Roa Assistant:  Dr. Gerald Leitzara Cole  Estimated blood loss: 800cc noted on pad on arrival to OR, 800cc intraop (total 1600cc) Products: 4upRBC, 2uFFP   Procedure: Pt was undergoing fluid resuscitation upon entry into the OR with IV fluids and pRBC.  Pt was placed under general anesthesia.  She was placed in the lithotomy position then prepped and draped in a sterile fashion.  Foley catheter was already in place.  Bimanual exam was performed, uterus noted to be firm, but enlarged to level of umbilicus.  Pelvic exam revealed active bleeding both intrauterine and from cervix.  Large posterior cervical laceration at 12 o'clock was noted.  Repair performed using 3-0 vicryl in a running fashion.  A vaginal packing was placed in the posterior cul-de-sac and attention was turn to the intrauterine bleeding. Under US guidance a small 2cm area was noted concerning for retained products.  Gentle curettage was performed using the banjo curet.  Small products of conception were obtained.  Repeat US showed a thin lining. Attention was turned back to her cervix, the posterior repair was reinforced and packing was placed while additional products were given.  Bleeding seemed to improve; however the uterus was then noted to be boggy.  Methergine 0.2mg  IM and Hemabate were given.  Uterus was noted to be firm and bleeding improved. The laceration was examined- though mild oozing was noted, there was no bright red bleeding or visible laceration.  A Kurlex was placed for packing and pt was taken to PACU in stable condition.  Sponge, lap and needle counts were correct at the end of  the procedure.  Dr. Richardson Doppole was present to assist with care due to the acuity of this patient.  Myna HidalgoJennifer Sofie Schendel, DO [phone] (pager) [phone] (office)

## 2015-10-04 NOTE — Progress Notes (Signed)
Continuation of PPH:  Pt continues to pass grapefruit sized clots, concern for retained products.  Plan to transfuse 2u pRBC as well as D&E.  Risk, benefits reviewed with patient, verbal consent obtained.    BP 97/58 mmHg  Pulse 88  Temp(Src) 98.1 F (36.7 C) (Oral)  Resp 20  Ht 5\' 4"  (1.626 m)  Wt 65.772 kg (145 lb)  BMI 24.88 kg/m2  SpO2 99%  LMP 12/29/2014  Breastfeeding? Unknown  Gen: responsive CV: Tachycardia Abd: soft, uterus @ umbilicus  Continue IV fluids Transfuse 2upRBCs Plan for D&E  Myna HidalgoJennifer Chalmers Iddings, DO [phone] (pager) [phone] (office)

## 2015-10-04 NOTE — Anesthesia Preprocedure Evaluation (Addendum)
Anesthesia Evaluation  Patient identified by MRN, date of birth, ID band Patient awake    Reviewed: Allergy & Precautions, NPO status , Patient's Chart, lab work & pertinent test results  Airway Mallampati: II  TM Distance: >3 FB Neck ROM: Full    Dental no notable dental hx. (+) Dental Advisory Given   Pulmonary former smoker,    Pulmonary exam normal breath sounds clear to auscultation       Cardiovascular negative cardio ROS Normal cardiovascular exam Rhythm:Regular Rate:Normal     Neuro/Psych negative neurological ROS  negative psych ROS   GI/Hepatic negative GI ROS, Neg liver ROS,   Endo/Other  negative endocrine ROS  Renal/GU negative Renal ROS  negative genitourinary   Musculoskeletal negative musculoskeletal ROS (+)   Abdominal   Peds negative pediatric ROS (+)  Hematology negative hematology ROS (+)   Anesthesia Other Findings   Reproductive/Obstetrics negative OB ROS                           Lab Results  Component Value Date   WBC 25.4* 10/04/2015   HGB 8.1* 10/04/2015   HCT 24.0* 10/04/2015   MCV 97.6 10/04/2015   PLT 201 10/04/2015   No results found for: INR, PROTIME   Anesthesia Physical Anesthesia Plan  ASA: II  Anesthesia Plan: General   Post-op Pain Management:    Induction: Intravenous  Airway Management Planned: Oral ETT  Additional Equipment:   Intra-op Plan:   Post-operative Plan: Extubation in OR  Informed Consent: I have reviewed the patients History and Physical, chart, labs and discussed the procedure including the risks, benefits and alternatives for the proposed anesthesia with the patient or authorized representative who has indicated his/her understanding and acceptance.   Dental advisory given  Plan Discussed with: CRNA  Anesthesia Plan Comments:       Anesthesia Quick Evaluation

## 2015-10-04 NOTE — Anesthesia Postprocedure Evaluation (Signed)
  Anesthesia Post-op Note  Patient: Lauren PayorAlexandria Noble  Procedure(s) Performed: Procedure(s): DILATATION AND EVACUATION (N/A)  Patient Location: Antenatal  Anesthesia Type:General  Level of Consciousness: awake, alert , oriented and patient cooperative  Airway and Oxygen Therapy: Patient Spontanous Breathing  Post-op Pain: none  Post-op Assessment: Post-op Vital signs reviewed, Patient's Cardiovascular Status Stable, Respiratory Function Stable, Patent Airway and No signs of Nausea or vomiting              Post-op Vital Signs: Reviewed and stable  Last Vitals:  Filed Vitals:   10/04/15 1229  BP:   Pulse: 88  Temp:   Resp:     Complications: No apparent anesthesia complications

## 2015-10-04 NOTE — Progress Notes (Signed)
Code hemorrhage called d/t EBL >1500, decreased blood pressure 68/30, and decrease in orientation. Started IV fluid LR bolus and continued fundal massage until team arrived. Initiated pphem orders assisted by Dr. Emelda FearFerguson. Dr. Charlotta Newtonzan notified and on her way

## 2015-10-04 NOTE — Progress Notes (Signed)
Patient ID: Lauren PayorAlexandria Noble, female   DOB: 1991/04/06, 24 y.o.   MRN: [id number] Called to room 123 for Code hemorrhage, pt pale, with initial bp 50/30's, pulse still 70, in supine position with 1600 cc of expelled clots reported. Pt given neosynephrine x 2 cc, second iv started, fluid bolus x 2 liters , given PO cytotec x 800 mcg. Pt with bimanual , I was able to express an additional 700 cc of clots, and find uterus firm, with uterus going from U+3 on right of abdomen, to u-0 in midline, most of clots were in vag vault, some in lower ut segement. Type and cross completed x 2 units. Pitocin infusing. Currently pt is shaking, bp 110's/ 70 with pulse 108, and Dr Charlotta Newtonzan is here.  [name],Leontae Bostock V

## 2015-10-04 NOTE — Progress Notes (Signed)
Post Partum Day 0 s/p SVD with postpartum hemorrhage. POD #0 D&C and repair of cervical laceration  Subjective: tolerating PO Feels better. Bleeding is very light.   Objective: Blood pressure 98/51, pulse 85, temperature 98.2 F (36.8 C), temperature source Oral, resp. rate 18, height 5\' 4"  (1.626 m), weight 65.772 kg (145 lb), last menstrual period 12/29/2014, SpO2 99 %, unknown if currently breastfeeding.  Physical Exam:  General: alert and cooperative Lochia: appropriate Uterine Fundus: firm Incision: NA DVT Evaluation: No evidence of DVT seen on physical exam.   Recent Labs  10/04/15 0821 10/04/15 1305  HGB 7.9* 9.1*  HCT 23.0* 27.0*    Assessment/Plan:  Postpartum hemorrhage.  Uterine Atony resolved. Pt is currently stable. Methergine 0.2 mg q 4 hours  Repeat cbc in AM Remove vaginal packing in AM Dr. Charlotta Newtonzan covering 10/05/2015 starting at 7 am Pt desires depoprovera prior to discharge.   LOS: 1 day   Djeneba Barsch J. 10/04/2015, 7:59 PM

## 2015-10-04 NOTE — Progress Notes (Addendum)
[facility] Uterus boggy with large lochia and clots, massaged well and firmed. Received report from L/D RN and preceded to call Dr. Charlotta Newtonzan for further orders for Methergine. Pt was unable to consume Methergine by mouth due to decrease orientation, dizziness and weakness. Did call a post partum hemorrhage due to associated factors. History of pphem with last delivery requiring cytotec, EBL in L/D > 1200 and increasing, G3P3, traumatic delivery w/shoulder dystocia, decline in current vital signs. Dr. Emelda FearFerguson to place orders for cytotec, phynelephrine, 2 units PRBC, LR with 40units pitocin, foley catheter per guidelines. 1 mg dilaudid give prior to curettage. Patient prep for O.R. Consent signed. Verbal consent for blood products witnessed by myself and L.Tietje RN, [place] Abtsathy Larabe, RN. Patient to OR for D/E. Mrs. Drucie OpitzStrickland's mother and father of the baby have been notified via telephone/text per Mrs. Loughmiller's request.  Baby to nursery at 0600 via M. Darnelle GoingMikles, RN

## 2015-10-04 NOTE — Anesthesia Postprocedure Evaluation (Signed)
  Anesthesia Post-op Note  Patient: Lauren PayorAlexandria Noble  Procedure(s) Performed: Procedure(s): DILATATION AND EVACUATION (N/A)  Patient Location: PACU  Anesthesia Type:General  Level of Consciousness: awake and alert   Airway and Oxygen Therapy: Patient Spontanous Breathing  Post-op Pain: none  Post-op Assessment: Post-op Vital signs reviewed              Post-op Vital Signs: Reviewed  Last Vitals:  Filed Vitals:   10/04/15 0945  BP: 98/64  Pulse: 86  Temp:   Resp: 15    Complications: No apparent anesthesia complications

## 2015-10-05 ENCOUNTER — Encounter (HOSPITAL_COMMUNITY): Payer: Self-pay | Admitting: Obstetrics & Gynecology

## 2015-10-05 LAB — CBC WITH DIFFERENTIAL/PLATELET
Basophils Absolute: 0 10*3/uL (ref 0.0–0.1)
Basophils Relative: 0 %
Eosinophils Absolute: 0 10*3/uL (ref 0.0–0.7)
Eosinophils Relative: 0 %
HCT: 27 % — ABNORMAL LOW (ref 36.0–46.0)
Hemoglobin: 9.1 g/dL — ABNORMAL LOW (ref 12.0–15.0)
Lymphocytes Relative: 11 %
Lymphs Abs: 1.8 10*3/uL (ref 0.7–4.0)
MCH: 30 pg (ref 26.0–34.0)
MCHC: 33.7 g/dL (ref 30.0–36.0)
MCV: 89.1 fL (ref 78.0–100.0)
Monocytes Absolute: 1.5 10*3/uL — ABNORMAL HIGH (ref 0.1–1.0)
Monocytes Relative: 9 %
Neutro Abs: 13.7 10*3/uL — ABNORMAL HIGH (ref 1.7–7.7)
Neutrophils Relative %: 80 %
Platelets: 124 10*3/uL — ABNORMAL LOW (ref 150–400)
RBC: 3.03 MIL/uL — ABNORMAL LOW (ref 3.87–5.11)
RDW: 16.1 % — ABNORMAL HIGH (ref 11.5–15.5)
WBC: 17.1 10*3/uL — ABNORMAL HIGH (ref 4.0–10.5)

## 2015-10-05 LAB — CBC
HCT: 21.9 % — ABNORMAL LOW (ref 36.0–46.0)
Hemoglobin: 7.6 g/dL — ABNORMAL LOW (ref 12.0–15.0)
MCH: 30.8 pg (ref 26.0–34.0)
MCHC: 34.7 g/dL (ref 30.0–36.0)
MCV: 88.7 fL (ref 78.0–100.0)
PLATELETS: 123 10*3/uL — AB (ref 150–400)
RBC: 2.47 MIL/uL — ABNORMAL LOW (ref 3.87–5.11)
RDW: 16.9 % — AB (ref 11.5–15.5)
WBC: 12.8 10*3/uL — AB (ref 4.0–10.5)

## 2015-10-05 LAB — PREPARE FRESH FROZEN PLASMA
UNIT DIVISION: 0
Unit division: 0

## 2015-10-05 LAB — HEMOGLOBIN AND HEMATOCRIT, BLOOD
HEMATOCRIT: 22.9 % — AB (ref 36.0–46.0)
HEMOGLOBIN: 7.9 g/dL — AB (ref 12.0–15.0)

## 2015-10-05 MED ORDER — METHYLERGONOVINE MALEATE 0.2 MG PO TABS
0.2000 mg | ORAL_TABLET | Freq: Four times a day (QID) | ORAL | Status: AC
  Administered 2015-10-05 (×3): 0.2 mg via ORAL
  Filled 2015-10-05 (×3): qty 1

## 2015-10-05 NOTE — Progress Notes (Signed)
Post Partum Day 1 s/p SVD with postpartum hemorrhage. POD #1 D&C and repair of cervical laceration  Subjective: Patient resting comfortably in bed and reports feeling much better today.  Denies F/C/CP/SOB.  No headache, no blurry vision, no dizziness.  Pt has not yet ambulated.  Vaginal packing and foley in place.  Peripad with minimal old blood.  Pt plans on breastfeeding.  Objective: BP 86/48 mmHg  Pulse 77  Temp(Src) 98.4 F (36.9 C) (Oral)  Resp 16  Ht 5\' 4"  (1.626 m)  Wt 65.772 kg (145 lb)  BMI 24.88 kg/m2  SpO2 99%  LMP 12/29/2014  Breastfeeding? Unknown UOP:/  Physical Exam:  General: alert and cooperative  CV: RRR Lungs; CTAB Abd: soft, no rebound, no guarding, +BS Uterine Fundus: firm, below umbilicus GU: Vaginal packing removed old blood noted on packing DVT Evaluation: No evidence of DVT seen on physical exam. No edema  Results for orders placed or performed during the hospital encounter of 10/03/15 (from the past 24 hour(s))  Initiate postpartum hemorrage protocol (BB notification)     Status: None   Collection Time: 10/04/15  7:21 AM  Result Value Ref Range   Initiate PPH protocol (BB notified) PPH ORDER RECEIVED   DIC (disseminated intravasc coag) panel     Status: Abnormal   Collection Time: 10/04/15  7:25 AM  Result Value Ref Range   Prothrombin Time 15.4 (H) 11.6 - 15.2 seconds   INR 1.20 0.00 - 1.49   aPTT 27 24 - 37 seconds   Fibrinogen 299 204 - 475 mg/dL   D-Dimer, Quant 1.612.66 (H) 0.00 - 0.50 ug/mL-FEU   Platelets 227 150 - 400 K/uL   Smear Review NO SCHISTOCYTES SEEN   Prepare fresh frozen plasma     Status: None (Preliminary result)   Collection Time: 10/04/15  7:48 AM  Result Value Ref Range   Unit Number [id number]    Blood Component Type LIQ PLASMA    Unit division 00    Status of Unit ISSUED    Transfusion Status OK TO TRANSFUSE    Unit Number [id number]    Blood Component Type LIQ PLASMA    Unit division 00    Status of Unit ISSUED     Transfusion Status OK TO TRANSFUSE   Initiate MTP (Blood Bank Notification)     Status: None   Collection Time: 10/04/15  7:49 AM  Result Value Ref Range   Initiate Massive Transfusion Protocol MTP ORDER RECEIVED   Hemoglobin and hematocrit, blood (STAT)     Status: Abnormal   Collection Time: 10/04/15  8:21 AM  Result Value Ref Range   Hemoglobin 7.9 (L) 12.0 - 15.0 g/dL   HCT 65.723.0 (L) 84.636.0 - 96.246.0 %  CBC with Differential/Platelet     Status: Abnormal   Collection Time: 10/04/15  1:05 PM  Result Value Ref Range   WBC 17.1 (H) 4.0 - 10.5 K/uL   RBC 3.03 (L) 3.87 - 5.11 MIL/uL   Hemoglobin 9.1 (L) 12.0 - 15.0 g/dL   HCT 95.227.0 (L) 84.136.0 - 32.446.0 %   MCV 89.1 78.0 - 100.0 fL   MCH 30.0 26.0 - 34.0 pg   MCHC 33.7 30.0 - 36.0 g/dL   RDW 40.116.1 (H) 02.711.5 - 25.315.5 %   Platelets 124 (L) 150 - 400 K/uL   Neutrophils Relative % 80 %   Neutro Abs 13.7 (H) 1.7 - 7.7 K/uL   Lymphocytes Relative 11 %   Lymphs Abs 1.8 0.7 -  4.0 K/uL   Monocytes Relative 9 %   Monocytes Absolute 1.5 (H) 0.1 - 1.0 K/uL   Eosinophils Relative 0 %   Eosinophils Absolute 0.0 0.0 - 0.7 K/uL   Basophils Relative 0 %   Basophils Absolute 0.0 0.0 - 0.1 K/uL  APTT     Status: None   Collection Time: 10/04/15  1:05 PM  Result Value Ref Range   aPTT 30 24 - 37 seconds  Fibrinogen     Status: None   Collection Time: 10/04/15  1:05 PM  Result Value Ref Range   Fibrinogen 266 204 - 475 mg/dL  Protime-INR     Status: Abnormal   Collection Time: 10/04/15  1:05 PM  Result Value Ref Range   Prothrombin Time 15.6 (H) 11.6 - 15.2 seconds   INR 1.22 0.00 - 1.49  CBC     Status: Abnormal   Collection Time: 10/05/15  5:40 AM  Result Value Ref Range   WBC 12.8 (H) 4.0 - 10.5 K/uL   RBC 2.47 (L) 3.87 - 5.11 MIL/uL   Hemoglobin 7.6 (L) 12.0 - 15.0 g/dL   HCT 40.9 (L) 81.1 - 91.4 %   MCV 88.7 78.0 - 100.0 fL   MCH 30.8 26.0 - 34.0 pg   MCHC 34.7 30.0 - 36.0 g/dL   RDW 78.2 (H) 95.6 - 21.3 %   Platelets 123 (L) 150 - 400 K/uL     Assessment/Plan:  23yo [id number] s/p NSVD complicated by PPH due to cervical laceration and retained products.  -EBL ~4000cc, s/p 4upRBC, 2u FFP -Uterne atony resolved. S/p Methergine x 24hr -Hgb with appropriate decline after PPH, pt asymptomatic, VSS, will continue to closely monitor.  Plan for repeat CBC @ 1200 today -iron twice daily -Vaginal packing removed, will closely monitor bleeding -Plan for removal of foley, once pt is ambulating -If Hgb stable and foley removed without difficulty, will plan to transfer to postpartum floor later this afternoon -Pt desires depoprovera prior to discharge.   Myna Hidalgo, M 10/05/2015, 6:45 AM

## 2015-10-05 NOTE — Progress Notes (Signed)
UR chart review completed.  

## 2015-10-05 NOTE — Clinical Documentation Improvement (Addendum)
OB/GYN  (please document your query response in the progress notes and discharge summary, not on the BPA itself.)  Possible Clinical Conditions:  - Shock during or following labor and delivery  - Hemorrhagic Shock  - Other condition  - Unable to clinically determine  Clinical Information: EBL per progress note 10/05/15 - 4000 cc.   Patient was treated with Neo, 1700 mls Lactated Ringers, 2600 mls NS intraoperatively.  Transfused 4 units of PRBC, 2 units FFP.   Systolic BP noted to be in the 50's - 60's.   Nursing documented that the patient had "decreased orientation, pt was unable to consume Methergine by mouth due to decrease orientation, dizziness and weakness.  Please exercise your independent, professional judgment when responding. A specific answer is not anticipated or expected.   Thank You,  Jerral Ralphathy R Asberry Lascola  RN BSN CCDS [phone] Health Information Management [place]

## 2015-10-05 NOTE — Lactation Note (Signed)
This note was copied from the chart of Lauren Gemma PayorAlexandria Art. Lactation Consultation Note  Patient Name: [name] Today's Date: 10/05/2015 Reason for consult: Follow-up assessment   Follow up with mom in antenatal. Mom reports that she wants to bottle feed as she has no milk and infant is tolerating formula. She also says she does not want to put stress on herself. Discussed supply and demand and milk coming in and that there is small amounts of colostrum in the breasts. She reports she has not BF the infant since last night. She did ask if she should still be putting the infant to breast, I encouraged her to do so. Advised her that pumping to bring in milk would be advised at this point since infant not BF, mom declined pumping. Enc mom to call with questions/concerns or assistance if needed.     Maternal Data Formula Feeding for Exclusion: No  Feeding Feeding Type: Bottle Fed - Formula Nipple Type: Slow - flow  LATCH Score/Interventions                      Lactation Tools Discussed/Used     Consult Status Consult Status: PRN Follow-up type: Call as needed    Ed BlalockSharon S Haja Crego 10/05/2015, 10:45 AM

## 2015-10-06 MED ORDER — IBUPROFEN 600 MG PO TABS: 600.0000 mg | ORAL_TABLET | Freq: Four times a day (QID) | ORAL | Status: DC

## 2015-10-06 MED ORDER — SERTRALINE HCL 25 MG PO TABS: 25.0000 mg | ORAL_TABLET | Freq: Every day | ORAL | Status: AC

## 2015-10-06 MED ORDER — FERROUS SULFATE 325 (65 FE) MG PO TABS: 325.0000 mg | ORAL_TABLET | Freq: Two times a day (BID) | ORAL | Status: DC

## 2015-10-06 MED ORDER — DOCUSATE SODIUM 100 MG PO CAPS: 100.0000 mg | ORAL_CAPSULE | Freq: Two times a day (BID) | ORAL | Status: DC

## 2015-10-06 MED ORDER — FERROUS SULFATE 325 (65 FE) MG PO TABS: 325.0000 mg | ORAL_TABLET | Freq: Two times a day (BID) | ORAL | Status: AC

## 2015-10-06 NOTE — Discharge Instructions (Addendum)
Postpartum Care After Vaginal Delivery After you deliver your newborn (postpartum period), the usual stay in the hospital is 24-72 hours. If there were problems with your labor or delivery, or if you have other medical problems, you might be in the hospital longer.  While you are in the hospital, you will receive help and instructions on how to care for yourself and your newborn during the postpartum period.  While you are in the hospital:  Be sure to tell your nurses if you have pain or discomfort, as well as where you feel the pain and what makes the pain worse.   If you had an incision made near your vagina (episiotomy) or if you had some tearing during delivery, the nurses may put ice packs on your episiotomy or tear. The ice packs may help to reduce the pain and swelling.  MEDICATIONS FOR HOME:  Motrin for pain as needed  Zoloft daily  Iron twice per day-  This medication may cause constipation so if needed please take colace twice daily with the iron  Prenatal vitamin daily  Vitamin D daily  If you are breastfeeding, you may feel uncomfortable contractions of your uterus for a couple of weeks. This is normal. The contractions help your uterus get back to normal size.  It is normal to have some bleeding after delivery.  For the first 1-3 days after delivery, the flow is red and the amount may be similar to a period.  It is common for the flow to start and stop.  In the first few days, you may pass some small clots. Let your nurses know if you begin to pass large clots or your flow increases.  Do not  flush blood clots down the toilet before having the nurse look at them.  During the next 3-10 days after delivery, your flow should become more watery and pink or brown-tinged in color.  Ten to fourteen days after delivery, your flow should be a small amount of yellowish-white discharge.  The amount of your flow will decrease over the first few weeks after delivery. Your flow  may stop in 6-8 weeks. Most women have had their flow stop by 12 weeks after delivery.  You should change your sanitary pads frequently.  Wash your hands thoroughly with soap and water for at least 20 seconds after changing pads, using the toilet, or before holding or feeding your newborn.  You should feel like you need to empty your bladder within the first 6-8 hours after delivery.  In case you become weak, lightheaded, or faint, call your nurse before you get out of bed for the first time and before you take a shower for the first time.  Within the first few days after delivery, your breasts may begin to feel tender and full. This is called engorgement. Breast tenderness usually goes away within 48-72 hours after engorgement occurs. You may also notice milk leaking from your breasts. If you are not breastfeeding, do not stimulate your breasts. Breast stimulation can make your breasts produce more milk.  Spending as much time as possible with your newborn is very important. During this time, you and your newborn can feel close and get to know each other. Having your newborn stay in your room (rooming in) will help to strengthen the bond with your newborn. It will give you time to get to know your newborn and become comfortable caring for your newborn.  Your hormones change after delivery. Sometimes the hormone changes can temporarily  cause you to feel sad or tearful. These feelings should not last more than a few days. If these feelings last longer than that, you should talk to your caregiver.  If desired, talk to your caregiver about methods of family planning or contraception.  Talk to your caregiver about immunizations. Your caregiver may want you to have the following immunizations before leaving the hospital:  Tetanus, diphtheria, and pertussis (Tdap) or tetanus and diphtheria (Td) immunization. It is very important that you and your family (including grandparents) or others caring for  your newborn are up-to-date with the Tdap or Td immunizations. The Tdap or Td immunization can help protect your newborn from getting ill.  Rubella immunization.  Varicella (chickenpox) immunization.  Influenza immunization. You should receive this annual immunization if you did not receive the immunization during your pregnancy.   This information is not intended to replace advice given to you by your health care provider. Make sure you discuss any questions you have with your health care provider.   Document Released: 08/31/2007 Document Revised: 07/28/2012 Document Reviewed: 06/30/2012 Elsevier Interactive Patient Education [employer]2016 Elsevier Inc.

## 2015-10-06 NOTE — Progress Notes (Signed)
Post Partum Day 2 s/p SVD with postpartum hemorrhage. POD #2 D&C and repair of cervical laceration  Subjective: Patient continues to feel better, states she feels like her "normal self."  Denies F/C/CP/SOB.  No headache, no blurry vision, no dizziness.  Ambulating and voiding freely.  Minimal lochia. Pt plans on breastfeeding.  Objective: BP 100/56 mmHg  Pulse 68  Temp(Src) 98.4 F (36.9 C) (Oral)  Resp 16  Ht 5\' 4"  (1.626 m)  Wt 65.772 kg (145 lb)  BMI 24.88 kg/m2  SpO2 99%  LMP 12/29/2014  Breastfeeding? Unknown  Physical Exam:  General: alert and cooperative  CV: RRR Lungs; CTAB Abd: soft, no rebound, no guarding Uterine Fundus: firm, below umbilicus DVT Evaluation: No evidence of DVT seen on physical exam. No edema  CBC Latest Ref Rng 10/05/2015 10/05/2015 10/04/2015  WBC 4.0 - 10.5 K/uL - 12.8(H) 17.1(H)  Hemoglobin 12.0 - 15.0 g/dL 7.9(L) 7.6(L) 9.1(L)  Hematocrit 36.0 - 46.0 % 22.9(L) 21.9(L) 27.0(L)  Platelets 150 - 400 K/uL - 123(L) 124(L)    Assessment/Plan:  89MW 23 G3P3003 s/p NSVD complicated by PPH due to cervical laceration and retained products. PPD#2  -EBL ~4000cc, s/p 4upRBC, 2u FFP -Uterne atony resolved. S/p Methergine x 24hr -Hgb stable as above, pt asymptomatic, VSS, continue iron twice daily  Patient doing very well and meeting postpartum milestones appropriately -Depression/anxiety- zoloft 25mg  daily -Pt desires depo provera prior to discharge.  Plan for discharge home today  Myna HidalgoOZAN, Kaylob Wallen, M 10/06/2015, 8:39 AM

## 2015-10-07 LAB — TYPE AND SCREEN
ABO/RH(D): O POS
Antibody Screen: NEGATIVE
UNIT DIVISION: 0
UNIT DIVISION: 0
UNIT DIVISION: 0
UNIT DIVISION: 0
Unit division: 0
Unit division: 0
Unit division: 0

## 2015-10-09 NOTE — Discharge Summary (Signed)
OB Discharge Summary     Patient Name: [name] DOB: 08-07-1991 MRN: [id number]  Date of admission: 10/03/2015 Delivering MD: Myna Hidalgo   Date of discharge: 10/06/2015  Admitting diagnosis: 40 wks, latent labor Intrauterine pregnancy: [redacted]w[redacted]d     Secondary diagnosis:  Active Problems:   Intrauterine normal pregnancy   Postpartum hemorrhage   Cervical laceration   Uterine atony Hemorrhagic shock Additional problems: none     Discharge diagnosis: Term Pregnancy Delivered, PPH and Uterine atony, cervical laceration, retained products of conception, Hemorrhagic shock                                                                                               Post partum procedures: transfusion, curettage  Augmentation: AROM and Pitocin  Complications: Hemorrhage>1060mL  Hospital course:  Onset of Labor With Vaginal Delivery     24 y.o. yo [phone] at [redacted]w[redacted]d was admitted in Latent Laboron 10/03/2015. Patient had an uncomplicated labor course as follows:  Membrane Rupture Time/Date: 11:07 PM ,10/03/2015   Intrapartum Procedures: Episiotomy: None [1]                                         Lacerations:  None [1]  Patient had a delivery of a Viable infant. 10/04/2015  Information for the patient's newborn:  Andria Meuse [[id number]]  Delivery Method: Vaginal, Spontaneous Delivery (Filed from Delivery Summary)    Pateint had a complicated postpartum course as she developed a PPH due to atony, retained products and cervical laceration.  PPH was called several hours after delivery as pt was noted to have passage of large clots.  She was brought to the OR for exam under anesthesia.  For information regarding this procedure- Exam under anesthesia, repair of cervical laceration, curettage- please see the operative note.  She received 4u pRBC and 2u FFP while in the OR.  Total EBL: ~ 4000cc.  She also received uterotonic agents including Methergine and  Hemabate.  A vaginal packing was placed in the OR and pt was closely monitored following surgical management.   The packing was removed Day #1, Hgb and VS were stable and pt was asymptomatic.  Bleeding minimal.  She continued to improve and was discharged home on POD#2 in stable condition.  Upon discharge she was ambulating, tolerating a regular diet, passing flatus, and urinating well. Patient was discharged home in stable condition on 10/06/2015  1:00 PM.    Physical exam  Filed Vitals:   10/05/15 1419 10/05/15 1821 10/05/15 2336 10/06/15 0652  BP: 105/64 105/65 93/60 100/56  Pulse: 73 71  68  Temp: 98.1 F (36.7 C) 98.4 F (36.9 C)  98.4 F (36.9 C)  TempSrc: Oral Oral  Oral  Resp: 18 18  16   Height:      Weight:      SpO2: 99%      General: alert and cooperative  CV: RRRLungs; CTAB Abd: soft, no rebound, no guarding Uterine Fundus: firm, below umbilicus DVT Evaluation: No evidence  of DVT seen on physical exam. No edema  Labs: Lab Results  Component Value Date   WBC 12.8* 10/05/2015   HGB 7.9* 10/05/2015   HCT 22.9* 10/05/2015   MCV 88.7 10/05/2015   PLT 123* 10/05/2015   CMP Latest Ref Rng 07/15/2012  Glucose 70 - 99 mg/dL 409(W)  BUN 6 - 23 mg/dL 15  Creatinine 1.19 - 1.47 mg/dL 8.29  Sodium 562 - 130 mEq/L 137  Potassium 3.5 - 5.1 mEq/L 3.7  Chloride 96 - 112 mEq/L 105  CO2 19 - 32 mEq/L 25  Calcium 8.4 - 10.5 mg/dL 9.1    Discharge instruction: per After Visit Summary and "Baby and Me Booklet".  After visit meds:    Medication List    STOP taking these medications        oxyCODONE-acetaminophen 5-325 MG tablet  Commonly known as:  PERCOCET     valACYclovir 1000 MG tablet  Commonly known as:  VALTREX      TAKE these medications        docusate sodium 100 MG capsule  Commonly known as:  COLACE  Take 1 capsule (100 mg total) by mouth 2 (two) times daily.     ferrous sulfate 325 (65 FE) MG tablet  Take 1 tablet (325 mg total) by mouth 2  (two) times daily with a meal.     ibuprofen 600 MG tablet  Commonly known as:  ADVIL,MOTRIN  Take 1 tablet (600 mg total) by mouth every 6 (six) hours.     sertraline 25 MG tablet  Commonly known as:  ZOLOFT  Take 1 tablet (25 mg total) by mouth daily.     VITAMIN D PO  Take 1 tablet by mouth daily.        Diet: routine diet  Activity: Advance as tolerated. Pelvic rest for 6 weeks.   Outpatient follow up:2 weeks Follow up Appt:No future appointments. Follow up Visit:No Follow-up on file.  Postpartum contraception: Depo Provera  Newborn Data: Live born female  Birth Weight: 7 lb 15.7 oz (3620 g) APGAR: 8, 9  Baby Feeding: Breast Disposition:home with mother   10/09/2015 Myna Hidalgo, M, DO

## 2015-11-18 HISTORY — PX: WISDOM TOOTH EXTRACTION: SHX21

## 2016-03-18 ENCOUNTER — Other Ambulatory Visit: Payer: Self-pay | Admitting: Obstetrics and Gynecology

## 2016-03-18 ENCOUNTER — Other Ambulatory Visit (HOSPITAL_COMMUNITY)
Admission: RE | Admit: 2016-03-18 | Discharge: 2016-03-18 | Disposition: A | Discharge status: Home / Self Care | Payer: Medicaid Other | Source: Ambulatory Visit | Attending: Obstetrics and Gynecology | Admitting: Obstetrics and Gynecology

## 2016-03-18 DIAGNOSIS — Z01411 Encounter for gynecological examination (general) (routine) with abnormal findings: Secondary | ICD-10-CM | POA: Insufficient documentation

## 2016-03-20 LAB — CYTOLOGY - PAP

## 2017-03-19 ENCOUNTER — Other Ambulatory Visit (HOSPITAL_COMMUNITY)
Admission: RE | Admit: 2017-03-19 | Discharge: 2017-03-19 | Disposition: A | Discharge status: Home / Self Care | Payer: Medicaid Other | Source: Ambulatory Visit | Attending: Obstetrics and Gynecology | Admitting: Obstetrics and Gynecology

## 2017-03-19 ENCOUNTER — Other Ambulatory Visit: Payer: Self-pay | Admitting: Obstetrics and Gynecology

## 2017-03-19 DIAGNOSIS — Z113 Encounter for screening for infections with a predominantly sexual mode of transmission: Secondary | ICD-10-CM | POA: Insufficient documentation

## 2017-03-19 DIAGNOSIS — Z01411 Encounter for gynecological examination (general) (routine) with abnormal findings: Secondary | ICD-10-CM | POA: Insufficient documentation

## 2017-03-23 LAB — CYTOLOGY - PAP
Chlamydia: NEGATIVE
DIAGNOSIS: NEGATIVE
NEISSERIA GONORRHEA: NEGATIVE

## 2018-03-22 ENCOUNTER — Other Ambulatory Visit (HOSPITAL_COMMUNITY)
Admission: RE | Admit: 2018-03-22 | Discharge: 2018-03-22 | Disposition: A | Discharge status: Home / Self Care | Payer: Medicaid Other | Source: Ambulatory Visit | Attending: Obstetrics and Gynecology | Admitting: Obstetrics and Gynecology

## 2018-03-22 ENCOUNTER — Other Ambulatory Visit: Payer: Self-pay | Admitting: Obstetrics and Gynecology

## 2018-03-22 DIAGNOSIS — Z124 Encounter for screening for malignant neoplasm of cervix: Secondary | ICD-10-CM | POA: Diagnosis present

## 2018-03-25 LAB — CYTOLOGY - PAP
CHLAMYDIA, DNA PROBE: POSITIVE — AB
HPV: DETECTED — AB
NEISSERIA GONORRHEA: NEGATIVE

## 2018-05-13 ENCOUNTER — Other Ambulatory Visit: Payer: Self-pay | Admitting: Obstetrics and Gynecology

## 2018-05-19 NOTE — Patient Instructions (Addendum)
Your procedure is scheduled on: Wednesday, July 17  [facility]: 2 pm  Pick up the phone at the desk and dial [phone].  Call this number if you have problems the morning of surgery: [phone].  Remember: Do NOT eat food after midnight Tuesday  Do NOT drink clear liquids (including water) after 8:30 am Wednesday, day of surgery  Take these medicines the morning of surgery with a SIP OF WATER:  None  Brush your teeth day of surgery  Bring inhaler with you on day of surgery.  Do Not smoke on the day of surgery.  Stop herbal medications, vitamin supplements, Ibuprofen/NSAIDS at this time.  Do NOT wear jewelry (body piercing), metal hair clips/bobby pins, make-up, or nail polish. Do NOT wear lotions, powders, or perfumes.  You may wear deoderant. Do NOT shave for 48 hours prior to surgery. Do NOT bring valuables to the hospital. Contacts, dentures, or bridgework may not be worn into surgery.   Have a responsible adult drive you home and stay with you for 24 hours after your procedure

## 2018-05-26 ENCOUNTER — Encounter (HOSPITAL_COMMUNITY): Payer: Self-pay

## 2018-05-31 ENCOUNTER — Encounter (HOSPITAL_COMMUNITY)
Admission: RE | Admit: 2018-05-31 | Discharge: 2018-05-31 | Disposition: A | Discharge status: Home / Self Care | Payer: Medicaid Other | Source: Ambulatory Visit | Attending: Obstetrics and Gynecology | Admitting: Obstetrics and Gynecology

## 2018-05-31 ENCOUNTER — Other Ambulatory Visit: Payer: Self-pay

## 2018-05-31 ENCOUNTER — Encounter (HOSPITAL_COMMUNITY): Payer: Self-pay

## 2018-05-31 ENCOUNTER — Other Ambulatory Visit: Payer: Self-pay | Admitting: Obstetrics and Gynecology

## 2018-05-31 DIAGNOSIS — Z01812 Encounter for preprocedural laboratory examination: Secondary | ICD-10-CM | POA: Diagnosis present

## 2018-05-31 HISTORY — DX: Depression, unspecified: F32.A

## 2018-05-31 HISTORY — DX: Headache: R51

## 2018-05-31 HISTORY — DX: Cardiac murmur, unspecified: R01.1

## 2018-05-31 HISTORY — DX: Major depressive disorder, single episode, unspecified: F32.9

## 2018-05-31 HISTORY — DX: Headache, unspecified: R51.9

## 2018-05-31 HISTORY — DX: Anxiety disorder, unspecified: F41.9

## 2018-05-31 LAB — CBC
HCT: 39.5 % (ref 36.0–46.0)
HEMOGLOBIN: 13.3 g/dL (ref 12.0–15.0)
MCH: 32.3 pg (ref 26.0–34.0)
MCHC: 33.7 g/dL (ref 30.0–36.0)
MCV: 95.9 fL (ref 78.0–100.0)
PLATELETS: 226 10*3/uL (ref 150–400)
RBC: 4.12 MIL/uL (ref 3.87–5.11)
RDW: 11.8 % (ref 11.5–15.5)
WBC: 8.4 10*3/uL (ref 4.0–10.5)

## 2018-06-01 ENCOUNTER — Other Ambulatory Visit: Payer: Self-pay | Admitting: Obstetrics and Gynecology

## 2018-06-01 NOTE — H&P (Deleted)
  The note originally documented on this encounter has been moved the the encounter in which it belongs.  

## 2018-06-01 NOTE — H&P (Signed)
Subjective: Chief Complaint(s):   desires for permanent sterilizaton and CIN III.Marland Kitchen preop history and physical for laparoscopic tubal ligation and LEEP   HPI:  General 27 y/o presnets for preop history and physical examination in [facility] Hisotyr is significant for the following: she had an aSCUS can not exclude high grade lesion 03/2018. she was also diagnosed with chlamydia 03/2018.  Colpscopy on May 13, 2018 revealed CIN III at the 6 oclock position. ECC was benign. CIN I at 9 and 12 oclock position.  Patient desires permanent sterilization via laparoscopic bilateral tubal ligation. Current Medication: Taking  Vitamin D3 400 UNIT Tablet 2 tablets Orally Once a day     Iron 325 (65 Fe) MG Tablet 1 tablet Orally Once a day     Sertraline HCl 25 MG Tablet TAKE 1 TABLET ONCE DAILY. Orally Once a day   Not-Taking  Doxycycline Hyclate 100 MG Capsule 1 capsule Orally twice a day     Doxycycline 100 mg BID PO Take 1 tablet twice daily for 7 days     Metrogel-Vaginal 0.75% qd 5d 0.75% vaginal topical gel 5 g per vagina at bedtime     Xulane 150/35 mcg Transdermal Patch 1 patch transdermal patch applied to upper outer arm, abdomen, buttock or back each week x 3 weeks. Week 4 is patch free.     Depo-Provera(Medroxyprogesterone Acetate) 150 MG/ML Suspension 1 ml Intramuscular     Valtrex(valACYclovir HCl) 500 MG Tablet 1 tablet Orally twice a day     Medication List reviewed and reconciled with the patient   Medical History:  Anxiety disorder     vitamin D deficiency      Allergies/Intolerance:  N.K.D.A.   Gyn History:  Sexual activity not currently sexually active. Periods : every month. LMP 05/02/18. Denies Birth control none. Last pap smear date 03/22/18-abnormal . Denies Last mammogram date. Abnormal pap smear 9.3.14 LGSIL with high risk HPV. STD HSV, HPV.   OB History:  Number of pregnancies 3. Pregnancy # 1  live birth, vaginal delivery, girl. Pregnancy # 2 live birth, vaginal delivery, boy. Pregnancy # 3 live birth, vaginal delivery, girl (Dr. Charlotta Newton)- complicated by [facility] due to atony and cervical laceration.   Surgical History:  D&C for retained placenta and repair of cervical laceration. 09/2015   Hospitalization:  childbirth via vaginal 02/10/14     childbirth via vaginal 02/19/2008     child birth 09/2015   Family History:  Father: deceased, adopted    Mother: alive, kidney failure    Paternal Grand Father: unknown    Paternal Grand Mother: unknown    Maternal Grand Father: deceased, diabetes, blood pressure problems, heart disease    Maternal Grand Mother: deceased, blood problems    1 son(s) , 2 daughter(s) .    denies any GYN family cancer hx, mother was diagnosed with pulmonary HTN.  Social History: General no EXPOSURE TO PASSIVE SMOKE.  Alcohol: yes, 2+ per week.  Children: 2 daughters, 1, son (s).  Caffeine: yes, soda.  Tobacco use cigarettes: Never smoked, Tobacco history last updated 05/25/2018.  Tobacco Exposure: second hand exposure.  Marital Status: single.  no Recreational drug use, yes sometimes weed - in past.  OCCUPATION: unemployed, self employed.  no Exercise.   ROS: CONSTITUTIONAL No" options="no,yes" propid="91" itemid="193425" categoryid="10464" encounterid="[id number]"Chills No. No" options="no,yes" propid="91" itemid="172899" categoryid="10464" encounterid="[id number]"Fatigue No. No" options="no,yes" propid="91" itemid="10467" categoryid="10464" encounterid="[id number]"Fever No. No" options="no,yes" propid="91" itemid="193426" categoryid="10464" encounterid="[id number]"Night sweats No. No"  options="no,yes" propid="91" itemid="444261" categoryid="10464" encounterid="[id number]"Recent travel outside [place] No. No" options="no,yes" propid="91" itemid="193427" categoryid="10464" encounterid="[id number]"Sweats No. No" options="no,yes" propid="91" itemid="194825" categoryid="10464"  encounterid="[id number]"Weight change No.  OPHTHALMOLOGY no" options="no,yes" propid="91" itemid="12520" categoryid="12516" encounterid="[id number]"Blurring of vision no. no" options="no,yes" propid="91" itemid="193469" categoryid="12516" encounterid="[id number]"Change in vision no. no" options="no,yes" propid="91" itemid="194379" categoryid="12516" encounterid="[id number]"Double vision no.  ENT no" options="no,yes" propid="91" itemid="193612" categoryid="10481" encounterid="[id number]"Dizziness no. Nose bleeds no. Sore throat no. Teeth pain no.  ALLERGY no" options="no,yes" propid="91" itemid="202589" categoryid="138152" encounterid="[id number]"Hives no.  CARDIOLOGY no" options="no,yes" propid="91" itemid="193603" categoryid="10488" encounterid="[id number]"Chest pain no. no" options="no,yes" propid="91" itemid="199089" categoryid="10488" encounterid="[id number]"High blood pressure no. no" options="no,yes" propid="91" itemid="202598" categoryid="10488" encounterid="[id number]"Irregular heart beat no. no" options="no,yes" propid="91" itemid="10491" categoryid="10488" encounterid="[id number]"Leg edema no. no" options="no,yes" propid="91" itemid="10490" categoryid="10488" encounterid="[id number]"Palpitations no.  RESPIRATORY no" options="no" propid="91" itemid="270013" categoryid="138132" encounterid="[id number]"Shortness of breath no. no" options="no,yes" propid="91" itemid="172745" categoryid="138132" encounterid="[id number]"Cough no. no" options="no,yes" propid="91" itemid="193621" categoryid="138132" encounterid="[id number]"Wheezing no.  UROLOGY no" options="no,yes" propid="91" itemid="194377" categoryid="138166" encounterid="[id number]"Pain with urination no. no" options="no,yes" propid="91" itemid="193493" categoryid="138166" encounterid="[id number]"Urinary urgency no. no" options="no,yes" propid="91" itemid="193492" categoryid="138166" encounterid="[id number]"Urinary frequency no. no" options="no,yes" propid="91" itemid="138171"  categoryid="138166" encounterid="[id number]"Urinary incontinence no. No" options="no,yes" propid="91" itemid="138167" categoryid="138166" encounterid="[id number]"Difficulty urinating No. No" options="no,yes" propid="91" itemid="138168" categoryid="138166" encounterid="[id number]"Blood in urine No.  GASTROENTEROLOGY no" options="no,yes" propid="91" itemid="10496" categoryid="10494" encounterid="[id number]"Abdominal pain no. no" options="no,yes" propid="91" itemid="193447" categoryid="10494" encounterid="[id number]"Appetite change no. no" options="no,yes" propid="91" itemid="193448" categoryid="10494" encounterid="[id number]"Bloating/belching no. no" options="no,yes" propid="91" itemid="10503" categoryid="10494" encounterid="[id number]"Blood in stool or on toilet paper no. no" options="no,yes" propid="91" itemid="199106" categoryid="10494" encounterid="[id number]"Change in bowel movements no. no" options="no,yes" propid="91" itemid="10501" categoryid="10494" encounterid="[id number]"Constipation no. no" options="no,yes" propid="91" itemid="10502" categoryid="10494" encounterid="[id number]"Diarrhea no. no" options="no,yes" propid="91" itemid="199104" categoryid="10494" encounterid="[id number]"Difficulty swallowing no. no" options="no,yes" propid="91" itemid="10499" categoryid="10494" encounterid="[id number]"Nausea no.  FEMALE REPRODUCTIVE no" options="no,yes" propid="91" itemid="453725" categoryid="10525" encounterid="[id number]"Vulvar pain no. no" options="no,yes" propid="91" itemid="453726" categoryid="10525" encounterid="[id number]"Vulvar rash no. no" options="no, yes" propid="91" itemid="444315" categoryid="10525" encounterid="[id number]"Abnormal vaginal bleeding no. no" options="no,yes" propid="91" itemid="186083" categoryid="10525" encounterid="[id number]"Breast pain no. no" options="no,yes" propid="91" itemid="186084" categoryid="10525" encounterid="[id number]"Nipple discharge no. no" options="no,yes" propid="91" itemid="275823"  categoryid="10525" encounterid="[id number]"Pain with intercourse no. no" options="no,yes" propid="91" itemid="186082" categoryid="10525" encounterid="[id number]"Pelvic pain no. no" options="no,yes" propid="91" itemid="278230" categoryid="10525" encounterid="[id number]"Unusual vaginal discharge no. no" options="no,yes" propid="91" itemid="278942" categoryid="10525" encounterid="[id number]"Vaginal itching no.  MUSCULOSKELETAL no" options="no,yes" propid="91" itemid="193461" categoryid="10514" encounterid="[id number]"Muscle aches no.  NEUROLOGY no" options="no,yes" propid="91" itemid="12513" categoryid="12512" encounterid="[id number]"Headache no. no" options="no,yes" propid="91" itemid="12514" categoryid="12512" encounterid="[id number]"Tingling/numbness no. no" options="no,yes" propid="91" itemid="193468" categoryid="12512" encounterid="10444580"Weakness no.  PSYCHOLOGY no" options="" propid="91" itemid="275919" categoryid="10520" encounterid="[id number]"Depression no. no" options="no,yes" propid="91" itemid="172748" categoryid="10520" encounterid="10444580"Anxiety no. no" options="no,yes" propid="91" itemid="199158" categoryid="10520" encounterid="10444580"Nervousness no. no" options="no,yes" propid="91" itemid="12502" categoryid="10520" encounterid="[id number]"Sleep disturbances no. no " options="no,yes" propid="91" itemid="72718" categoryid="10520" encounterid="10444580"Suicidal ideation no .  ENDOCRINOLOGY no" options="no,yes" propid="91" itemid="194628" categoryid="12508" encounterid="[id number]"Excessive thirst no. no" options="no,yes" propid="91" itemid="196285" categoryid="12508" encounterid="[id number]"Excessive urination no. no" options="no, yes" propid="91" itemid="444314" categoryid="12508" encounterid="10444580"Hair loss no. no" options="" propid="91" itemid="447284" categoryid="12508" encounterid="[id number]"Heat or cold intolerance no.  HEMATOLOGY/LYMPH no" options="no,yes" propid="91" itemid="199152"  categoryid="138157" encounterid="[id number]"Abnormal bleeding no. no" options="no,yes" propid="91" itemid="170653" categoryid="138157" encounterid="10444580"Easy bruising no. no" options="no,yes" propid="91" itemid="138158" categoryid="138157" encounterid="10444580"Swollen glands no.  DERMATOLOGY no" options="no,yes" propid="91" itemid="199126" categoryid="12503" encounterid="[id number]"New/changing skin lesion no. no" options="no,yes" propid="91" itemid="12504" categoryid="12503" encounterid="[id number]"Rash no. no" options="" propid="91" itemid="444313" categoryid="12503" encounterid="[id number]"Sores no.   Negative except as stated in HPI.  Objective: Vitals: Wt 133, Wt change -2 lb, Ht 63.5, BMI 23.19, Pulse sitting 74, BP sitting 100/60  Past Results: Examination:  General Examination alert, oriented, NAD " categoryPropId="10089" examid="193638"CONSTITUTIONAL: alert, oriented, NAD .  moist, warm" categoryPropId="10109" examid="193638"SKIN: moist, warm.  Conjunctiva clear" categoryPropId="21468" examid="193638"EYES: Conjunctiva clear.  clear to auscultation bilaterally" categoryPropId="87" examid="193638"LUNGS: clear to auscultation bilaterally.  regular rate and rhythm" categoryPropId="86" examid="193638"HEART: regular rate and  rhythm.  soft, non-tender/non-distended, bowel sounds present " categoryPropId="88" examid="193638"ABDOMEN: soft, non-tender/non-distended, bowel sounds present .  normal external genitalia, labia - unremarkable, vagina - pink moist mucosa, no lesions or abnormal discharge, cervix - no discharge or lesions or CMT, adnexa - no masses or tenderness, uterus - nontender and normal size on palpation " categoryPropId="13414" examid="193638"FEMALE GENITOURINARY: normal external genitalia, labia - unremarkable, vagina - pink moist mucosa, no lesions or abnormal discharge, cervix - no discharge or lesions or CMT, adnexa - no masses or tenderness, uterus - nontender and normal size on  palpation .  affect normal, good eye contact" categoryPropId="16316" examid="193638"PSYCH: affect normal, good eye contact.  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 R/B/A of surgery discussed with the patient including but not limited to infection/ bleeding damage to bowel bladder with the need for further surgery. 1 % r/o failure discussed with 50 % r/o ectopic pregnancy if failure occurs .. pt voiced understanding and desires to proceed.. CIN III (cervical intraepithelial neoplasia grade III) with severe dysplasia Notes: discussed results with the patient. REcommend LEEP.Marland Kitchen the procedure alternative and risk were discussed including but not limited to infection/ bleeding/ need for further surgery. f/o cervical stenosis and cervical incompetence reviewed.. r/o reoccurrence discussed. She voiced understanding and desires to proceed with LEEP.

## 2018-06-02 ENCOUNTER — Ambulatory Visit (HOSPITAL_COMMUNITY): Payer: Medicaid Other | Admitting: Certified Registered Nurse Anesthetist

## 2018-06-02 ENCOUNTER — Encounter (HOSPITAL_COMMUNITY): Payer: Self-pay | Admitting: Certified Registered Nurse Anesthetist

## 2018-06-02 ENCOUNTER — Ambulatory Visit (HOSPITAL_COMMUNITY)
Admission: AD | Admit: 2018-06-02 | Discharge: 2018-06-02 | Disposition: A | Discharge status: Home / Self Care | Payer: Medicaid Other | Source: Ambulatory Visit | Attending: Obstetrics and Gynecology | Admitting: Obstetrics and Gynecology

## 2018-06-02 ENCOUNTER — Encounter (HOSPITAL_COMMUNITY)
Admission: AD | Disposition: A | Discharge status: Home / Self Care | Payer: Self-pay | Source: Ambulatory Visit | Attending: Obstetrics and Gynecology

## 2018-06-02 DIAGNOSIS — F418 Other specified anxiety disorders: Secondary | ICD-10-CM | POA: Diagnosis not present

## 2018-06-02 DIAGNOSIS — Z302 Encounter for sterilization: Secondary | ICD-10-CM

## 2018-06-02 DIAGNOSIS — Z87891 Personal history of nicotine dependence: Secondary | ICD-10-CM | POA: Insufficient documentation

## 2018-06-02 DIAGNOSIS — D069 Carcinoma in situ of cervix, unspecified: Secondary | ICD-10-CM | POA: Diagnosis not present

## 2018-06-02 DIAGNOSIS — E559 Vitamin D deficiency, unspecified: Secondary | ICD-10-CM | POA: Insufficient documentation

## 2018-06-02 DIAGNOSIS — N871 Moderate cervical dysplasia: Secondary | ICD-10-CM | POA: Diagnosis not present

## 2018-06-02 DIAGNOSIS — Z79899 Other long term (current) drug therapy: Secondary | ICD-10-CM | POA: Insufficient documentation

## 2018-06-02 HISTORY — PX: LEEP: SHX91

## 2018-06-02 HISTORY — PX: LAPAROSCOPIC TUBAL LIGATION: SHX1937

## 2018-06-02 LAB — PREGNANCY, URINE: PREG TEST UR: NEGATIVE

## 2018-06-02 SURGERY — LIGATION, FALLOPIAN TUBE, LAPAROSCOPIC
Anesthesia: General | Site: Vagina

## 2018-06-02 MED ORDER — DEXAMETHASONE SODIUM PHOSPHATE 4 MG/ML IJ SOLN
INTRAMUSCULAR | Status: AC
  Filled 2018-06-02: qty 1

## 2018-06-02 MED ORDER — IBUPROFEN 800 MG PO TABS: 800.0000 mg | ORAL_TABLET | Freq: Three times a day (TID) | ORAL | 0 refills | Status: AC | PRN

## 2018-06-02 MED ORDER — MIDAZOLAM HCL 2 MG/2ML IJ SOLN
INTRAMUSCULAR | Status: DC | PRN
  Administered 2018-06-02: 2 mg via INTRAVENOUS

## 2018-06-02 MED ORDER — ONDANSETRON HCL 4 MG/2ML IJ SOLN
INTRAMUSCULAR | Status: AC
  Filled 2018-06-02: qty 2

## 2018-06-02 MED ORDER — KETOROLAC TROMETHAMINE 30 MG/ML IJ SOLN
INTRAMUSCULAR | Status: AC
  Filled 2018-06-02: qty 1

## 2018-06-02 MED ORDER — PROPOFOL 10 MG/ML IV BOLUS
INTRAVENOUS | Status: AC
  Filled 2018-06-02: qty 20

## 2018-06-02 MED ORDER — IODINE STRONG (LUGOLS) 5 % PO SOLN
ORAL | Status: AC
  Filled 2018-06-02: qty 1

## 2018-06-02 MED ORDER — LIDOCAINE HCL 1 % IJ SOLN
INTRAMUSCULAR | Status: DC | PRN
  Administered 2018-06-02: 16 mL

## 2018-06-02 MED ORDER — LIDOCAINE HCL 1 % IJ SOLN
INTRAMUSCULAR | Status: AC
  Filled 2018-06-02: qty 20

## 2018-06-02 MED ORDER — FENTANYL CITRATE (PF) 100 MCG/2ML IJ SOLN
INTRAMUSCULAR | Status: DC | PRN
  Administered 2018-06-02 (×3): 50 ug via INTRAVENOUS

## 2018-06-02 MED ORDER — SCOPOLAMINE 1 MG/3DAYS TD PT72
MEDICATED_PATCH | TRANSDERMAL | Status: AC
  Administered 2018-06-02: 1.5 mg via TRANSDERMAL
  Filled 2018-06-02: qty 1

## 2018-06-02 MED ORDER — IODINE STRONG (LUGOLS) 5 % PO SOLN
ORAL | Status: DC | PRN
  Administered 2018-06-02: 0.2 mL

## 2018-06-02 MED ORDER — ROCURONIUM BROMIDE 100 MG/10ML IV SOLN
INTRAVENOUS | Status: DC | PRN
  Administered 2018-06-02: 40 mg via INTRAVENOUS

## 2018-06-02 MED ORDER — FERRIC SUBSULFATE 259 MG/GM EX SOLN
CUTANEOUS | Status: AC
Start: 2018-06-02 — End: ?
  Filled 2018-06-02: qty 8

## 2018-06-02 MED ORDER — BUPIVACAINE HCL (PF) 0.25 % IJ SOLN
INTRAMUSCULAR | Status: DC | PRN
  Administered 2018-06-02: 10 mL
  Administered 2018-06-02: 20 mL

## 2018-06-02 MED ORDER — MEPERIDINE HCL 25 MG/ML IJ SOLN: 6.2500 mg | INTRAMUSCULAR | Status: DC | PRN

## 2018-06-02 MED ORDER — MIDAZOLAM HCL 2 MG/2ML IJ SOLN
INTRAMUSCULAR | Status: AC
  Filled 2018-06-02: qty 2

## 2018-06-02 MED ORDER — LACTATED RINGERS IV SOLN
INTRAVENOUS | Status: DC
  Administered 2018-06-02: 14:00:00 via INTRAVENOUS

## 2018-06-02 MED ORDER — DEXAMETHASONE SODIUM PHOSPHATE 10 MG/ML IJ SOLN
INTRAMUSCULAR | Status: DC | PRN
  Administered 2018-06-02: 4 mg via INTRAVENOUS

## 2018-06-02 MED ORDER — ONDANSETRON HCL 4 MG/2ML IJ SOLN
INTRAMUSCULAR | Status: DC | PRN
  Administered 2018-06-02: 4 mg via INTRAVENOUS

## 2018-06-02 MED ORDER — SUGAMMADEX SODIUM 200 MG/2ML IV SOLN
INTRAVENOUS | Status: DC | PRN
  Administered 2018-06-02: 150 mg via INTRAVENOUS

## 2018-06-02 MED ORDER — HYDROCODONE-ACETAMINOPHEN 7.5-325 MG PO TABS: 1.0000 | ORAL_TABLET | Freq: Once | ORAL | Status: DC | PRN

## 2018-06-02 MED ORDER — LIDOCAINE HCL (CARDIAC) PF 100 MG/5ML IV SOSY
PREFILLED_SYRINGE | INTRAVENOUS | Status: DC | PRN
  Administered 2018-06-02: 80 mg via INTRAVENOUS

## 2018-06-02 MED ORDER — FERRIC SUBSULFATE SOLN
Status: DC | PRN
  Administered 2018-06-02: 1

## 2018-06-02 MED ORDER — BUPIVACAINE HCL (PF) 0.25 % IJ SOLN
INTRAMUSCULAR | Status: AC
  Filled 2018-06-02: qty 30

## 2018-06-02 MED ORDER — FENTANYL CITRATE (PF) 250 MCG/5ML IJ SOLN
INTRAMUSCULAR | Status: AC
  Filled 2018-06-02: qty 5

## 2018-06-02 MED ORDER — SUGAMMADEX SODIUM 200 MG/2ML IV SOLN
INTRAVENOUS | Status: AC
  Filled 2018-06-02: qty 2

## 2018-06-02 MED ORDER — METOCLOPRAMIDE HCL 5 MG/ML IJ SOLN: 10.0000 mg | Freq: Once | INTRAMUSCULAR | Status: DC | PRN

## 2018-06-02 MED ORDER — KETOROLAC TROMETHAMINE 30 MG/ML IJ SOLN
INTRAMUSCULAR | Status: DC | PRN
  Administered 2018-06-02: 30 mg via INTRAVENOUS

## 2018-06-02 MED ORDER — PROPOFOL 10 MG/ML IV BOLUS
INTRAVENOUS | Status: DC | PRN
  Administered 2018-06-02: 200 mg via INTRAVENOUS
  Administered 2018-06-02: 50 mg via INTRAVENOUS
  Administered 2018-06-02 (×2): 30 mg via INTRAVENOUS

## 2018-06-02 MED ORDER — SCOPOLAMINE 1 MG/3DAYS TD PT72
1.0000 | MEDICATED_PATCH | Freq: Once | TRANSDERMAL | Status: DC
  Administered 2018-06-02: 1.5 mg via TRANSDERMAL

## 2018-06-02 MED ORDER — OXYCODONE HCL 5 MG PO TABS: 5.0000 mg | ORAL_TABLET | Freq: Four times a day (QID) | ORAL | 0 refills | Status: AC | PRN

## 2018-06-02 MED ORDER — HYDROMORPHONE HCL 1 MG/ML IJ SOLN: 0.2500 mg | INTRAMUSCULAR | Status: DC | PRN

## 2018-06-02 SURGICAL SUPPLY — 40 items
ADH SKN CLS APL DERMABOND .7 (GAUZE/BANDAGES/DRESSINGS) ×2
CATH ROBINSON RED A/P 16FR (CATHETERS) ×4 IMPLANT
DERMABOND ADVANCED (GAUZE/BANDAGES/DRESSINGS) ×2
DERMABOND ADVANCED .7 DNX12 (GAUZE/BANDAGES/DRESSINGS) ×2 IMPLANT
DILATOR CANAL MILEX (MISCELLANEOUS) IMPLANT
DRSG OPSITE POSTOP 3X4 (GAUZE/BANDAGES/DRESSINGS) ×2 IMPLANT
DURAPREP 26ML APPLICATOR (WOUND CARE) ×4 IMPLANT
ELECT BALL LEEP 3MM BLK (ELECTRODE) IMPLANT
ELECT BALL LEEP 5MM RED (ELECTRODE) IMPLANT
ELECT LOOP LEEP RND 10X10 YLW (CUTTING LOOP)
ELECT LOOP LEEP RND 15X12 GRN (CUTTING LOOP)
ELECT LOOP LEEP RND 20X12 WHT (CUTTING LOOP) ×4
ELECT LOOP LEEP SQR 10X10 ORG (CUTTING LOOP)
ELECTRODE LOOP LP RND 10X10YLW (CUTTING LOOP) IMPLANT
ELECTRODE LOOP LP RND 15X12GRN (CUTTING LOOP) IMPLANT
ELECTRODE LOOP LP RND 20X12WHT (CUTTING LOOP) IMPLANT
ELECTRODE LOOP LP SQR 10X10ORG (CUTTING LOOP) IMPLANT
EXTENDER ELECT LOOP LEEP 10CM (CUTTING LOOP) IMPLANT
GLOVE BIOGEL M 6.5 STRL (GLOVE) ×8 IMPLANT
GLOVE BIOGEL PI IND STRL 6.5 (GLOVE) ×4 IMPLANT
GLOVE BIOGEL PI IND STRL 7.0 (GLOVE) ×4 IMPLANT
GLOVE BIOGEL PI INDICATOR 6.5 (GLOVE) ×4
GLOVE BIOGEL PI INDICATOR 7.0 (GLOVE) ×4
GOWN STRL REUS W/TWL LRG LVL3 (GOWN DISPOSABLE) ×8 IMPLANT
PACK LAPAROSCOPY BASIN (CUSTOM PROCEDURE TRAY) ×4 IMPLANT
PACK TRENDGUARD 450 HYBRID PRO (MISCELLANEOUS) IMPLANT
PACK VAGINAL MINOR WOMEN LF (CUSTOM PROCEDURE TRAY) ×4 IMPLANT
PAD OB MATERNITY 4.3X12.25 (PERSONAL CARE ITEMS) ×4 IMPLANT
PROTECTOR NERVE ULNAR (MISCELLANEOUS) ×8 IMPLANT
SCOPETTES 8  STERILE (MISCELLANEOUS) ×4
SCOPETTES 8 STERILE (MISCELLANEOUS) ×4 IMPLANT
SOLUTION ELECTROLUBE (MISCELLANEOUS) ×4 IMPLANT
SUT VIC AB 0 CT1 27 (SUTURE)
SUT VIC AB 0 CT1 27XBRD ANBCTR (SUTURE) IMPLANT
SUT VICRYL 0 UR6 27IN ABS (SUTURE) ×4 IMPLANT
SUT VICRYL 4-0 PS2 18IN ABS (SUTURE) ×4 IMPLANT
TOWEL OR 17X24 6PK STRL BLUE (TOWEL DISPOSABLE) ×8 IMPLANT
TRENDGUARD 450 HYBRID PRO PACK (MISCELLANEOUS)
TROCAR XCEL NON-BLD 11X100MML (ENDOMECHANICALS) ×4 IMPLANT
WARMER LAPAROSCOPE (MISCELLANEOUS) ×4 IMPLANT

## 2018-06-02 NOTE — Anesthesia Procedure Notes (Signed)
Procedure Name: Intubation Date/Time: 06/02/2018 2:52 PM Performed by: Bufford Spikes, CRNA Pre-anesthesia Checklist: Patient identified, Emergency Drugs available, Suction available, Patient being monitored and Timeout performed Patient Re-evaluated:Patient Re-evaluated prior to induction Oxygen Delivery Method: Circle system utilized Preoxygenation: Pre-oxygenation with 100% oxygen Induction Type: IV induction Laryngoscope Size: Mac and 3 Grade View: Grade I Tube type: Oral Tube size: 7.0 mm Number of attempts: 1 Airway Equipment and Method: Stylet Placement Confirmation: ETT inserted through vocal cords under direct vision,  positive ETCO2 and breath sounds checked- equal and bilateral Secured at: 22 cm Tube secured with: Tape Dental Injury: Teeth and Oropharynx as per pre-operative assessment

## 2018-06-02 NOTE — H&P (Signed)
Date of Initial H&P:06/01/2018 History reviewed, patient examined, no change in status, stable for surgery. 

## 2018-06-02 NOTE — Anesthesia Postprocedure Evaluation (Signed)
Anesthesia Post Note  Patient: [name]  Procedure(s) Performed: LAPAROSCOPIC TUBAL LIGATION (Bilateral Abdomen) LOOP ELECTROSURGICAL EXCISION PROCEDURE (LEEP) (N/A Vagina )     Patient location during evaluation: PACU Anesthesia Type: General Level of consciousness: awake and alert and oriented Pain management: pain level controlled Vital Signs Assessment: post-procedure vital signs reviewed and stable Respiratory status: spontaneous breathing, nonlabored ventilation and respiratory function stable Cardiovascular status: blood pressure returned to baseline and stable Postop Assessment: no apparent nausea or vomiting Anesthetic complications: no    Last Vitals:  Vitals:   06/02/18 1700 06/02/18 1715  BP: 104/71 104/71  Pulse: 70 81  Resp: 16 13  Temp:    SpO2: 99% 96%    Last Pain:  Vitals:   06/02/18 1700  TempSrc:   PainSc: Asleep   Pain Goal: Patients Stated Pain Goal: 4 (06/02/18 1410)               [name] A.

## 2018-06-02 NOTE — Op Note (Signed)
06/02/2018  3:52 PM  PATIENT:  [name]  27 y.o. female  PRE-OPERATIVE DIAGNOSIS:  Z30.8 Contraceptive management  POST-OPERATIVE DIAGNOSIS:  contraceptive management CIN III  PROCEDURE:  Procedure(s): LAPAROSCOPIC TUBAL LIGATION (Bilateral) LOOP ELECTROSURGICAL EXCISION PROCEDURE (LEEP) (N/A)  SURGEON:  Surgeon(s) and Role:    Gerald Leitz* Rilda Bulls, MD - Primary  PHYSICIAN ASSISTANT:   ASSISTANTS: none   ANESTHESIA:   general  EBL:  10 cc    BLOOD ADMINISTERED:none  DRAINS: none   LOCAL MEDICATIONS USED:  MARCAINE    and LIDOCAINE   SPECIMEN:  Source of Specimen:  cervical conization   DISPOSITION OF SPECIMEN:  PATHOLOGY  COUNTS:  YES  TOURNIQUET:  * No tourniquets in log *  DICTATION: .Dragon Dictation  PLAN OF CARE: Discharge to home after PACU  PATIENT DISPOSITION:  PACU - hemodynamically stable.   Delay start of Pharmacological VTE agent (>24hrs) due to surgical blood loss or risk of bleeding: not applicable  Findings: Normal ovaries bilaterally. Normal fallopian tubes. Adhesions of the colon to the left pelvic side wall. 1 cm nodule noted at the 6 o'clock position of the cervix.    Procedure: The patient was taken to the operating room where she was placed under general anesthesia. She is placed in the dorsolithotomy position A time out was performed.  She was prepped and draped in the usual sterile fashion. Speculum was placed into the vaginal vault. In the anterior lip of the cervix grasped with a single-tooth tenaculum. A uterine manipulator was placed without difficulty. The single-tooth tenaculum was  Removed. The speculum was removed.  Attention was turned to the umbilicus which was injected with 10 cc of quarter percent Marcaine. A 11 mm incision was made with the scalpel. A 11 mm trocar was placed under direct visualization. Entry into the peritoneum was visualized. The pneumoperitoneum was achieved with CO2 gas. The pelvis was examined and the  patient was noted to have adhesions of the colon to the left pelvic side wall  Her  fallopian tubes and ovaries appeared normal.  Operative scope was inserted the  right fallopian tube was identified and followed out to the fimbriated end  The Kleppenger was used to cauterize along the ampullary portion  of the right fallopian  tube without difficulty.  The left fallopian tube was identified followed out to the fimbriated end the Kleppenger was used to cauterize along the ampullary portion of the left fallopian tube. . The kleppenger was removed. Laparoscopic scissors were used to cut the portion of the right tube that was cauterized. This was repeated on the left fallopian tube. The laparoscopic needle was inserted and 10 cc of quarter percent Marcaine was placed along the ampullary portion of the tubes  bilaterally. Pneumoperitoneum was released. The umbilical trocar was removed under direct visualization. The fascia was closed with 0 Vicryl. The skin was closed with 4-0 Vicryl. The dermabond placed a over the skin incision.  Attention was turned to the vagina. The uterine manipulator was removed. Speculum was placed. Lugols was used to identify dysplastic tissue. A loop electrode was used to excise a cone shaped portion of the cervix. Hemostasis was achieved with Monsels.   All instruments were removed from the vagina. The patient was awakened from anesthesia and taken to the recovery room in stable condition.    Sponge lap and needle counts were correct x2.   Patient was awakened from anesthesia taken to the recovery room awake and in stable condition.

## 2018-06-02 NOTE — Discharge Instructions (Signed)
Post Anesthesia Home Care Instructions  Activity: Get plenty of rest for the remainder of the day. A responsible individual must stay with you for 24 hours following the procedure.  For the next 24 hours, DO NOT: -Drive a car -Advertising copywriter -Drink alcoholic beverages -Take any medication unless instructed by your physician -Make any legal decisions or sign important papers.  Meals: Start with liquid foods such as gelatin or soup. Progress to regular foods as tolerated. Avoid greasy, spicy, heavy foods. If nausea and/or vomiting occur, drink only clear liquids until the nausea and/or vomiting subsides. Call your physician if vomiting continues.  Special Instructions/Symptoms: Your throat may feel dry or sore from the anesthesia or the breathing tube placed in your throat during surgery. If this causes discomfort, gargle with warm salt water. The discomfort should disappear within 24 hours.  If you had a scopolamine patch placed behind your ear for the management of post- operative nausea and/or vomiting:  1. The medication in the patch is effective for 72 hours, after which it should be removed.  Wrap patch in a tissue and discard in the trash. Wash hands thoroughly with soap and water. 2. You may remove the patch earlier than 72 hours if you experience unpleasant side effects which may include dry mouth, dizziness or visual disturbances. 3. Avoid touching the patch. Wash your hands with soap and water after contact with the patch.   Loop Electrosurgical Excision Procedure, Care After Refer to this sheet in the next few weeks. These instructions provide you with information about caring for yourself after your procedure. Your health care provider may also give you more specific instructions. Your treatment has been planned according to current medical practices, but problems sometimes occur. Call your health care provider if you have any problems or questions after your procedure. What  can I expect after the procedure? After the procedure, it is common to have:  Abdominal cramps that are similar to menstrual cramps. These may last for up to 1 week.  Pink-tinged or bloody vaginal discharge, including light to moderate bleeding, for 1-2 weeks.  A dark-colored vaginal discharge. This is from the paste that was applied to your cervix to control bleeding.  Follow these instructions at home: Activity  Return to your normal activities as told by your health care provider. Ask your health care provider what activities are safe for you.  Avoid strenuous physical activity for as long as told by your health care provider.  Do not lift anything that is heavier than 10 lb (4.5 kg) until your health care provider says that it is safe. Bathing  Do not take baths, swim, or use a hot tub until your health care provider approves.  You may take showers. Lifestyle  Do not put anything in your vagina for 2 weeks after the procedure or until your health care provider says that it is okay. This includes tampons, creams, and douches.  Do not have sexual intercourse until your health care provider approves. General instructions  Take over-the-counter and prescription medicines only as told by your health care provider.  Keep all follow-up visits as told by your health care provider. This is important. Contact a health care provider if:  You have a fever or chills.  You feel unusually weak.  You have vaginal bleeding that is heavier or longer than a normal menstrual cycle. A sign of this can be soaking a pad with blood.  You have severe pain.  You have nausea or vomiting.  You develop a bad smelling vaginal discharge. This information is not intended to replace advice given to you by your health care provider. Make sure you discuss any questions you have with your health care provider. Document Released: 07/17/2011 Document Revised: 11/30/2015 Document Reviewed:  09/17/2015 Elsevier Interactive Patient Education  2018 [employer]

## 2018-06-02 NOTE — Transfer of Care (Signed)
Immediate Anesthesia Transfer of Care Note  Patient: [name]  Procedure(s) Performed: LAPAROSCOPIC TUBAL LIGATION (Bilateral Abdomen) LOOP ELECTROSURGICAL EXCISION PROCEDURE (LEEP) (N/A Vagina )  Patient Location: PACU  Anesthesia Type:General  Level of Consciousness: awake and alert   Airway & Oxygen Therapy: Patient Spontanous Breathing and Patient connected to nasal cannula oxygen  Post-op Assessment: Report given to RN and Post -op Vital signs reviewed and stable  Post vital signs: Reviewed  Last Vitals:  Vitals Value Taken Time  BP 113/78 06/02/2018  4:15 PM  Temp    Pulse    Resp 16 06/02/2018  4:17 PM  SpO2    Vitals shown include unvalidated device data.  Last Pain:  Vitals:   06/02/18 1410  TempSrc: Oral      Patients Stated Pain Goal: 4 (06/02/18 1410)  Complications: No apparent anesthesia complications

## 2018-06-02 NOTE — Anesthesia Preprocedure Evaluation (Addendum)
Anesthesia Evaluation  Patient identified by MRN, date of birth, ID band Patient awake    Reviewed: Allergy & Precautions, NPO status , Patient's Chart, lab work & pertinent test results  Airway Mallampati: II  TM Distance: >3 FB Neck ROM: Full    Dental no notable dental hx. 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                             Anesthesia Physical Anesthesia Plan  ASA: II  Anesthesia Plan: General   Post-op Pain Management:    Induction: Intravenous  PONV Risk Score and Plan: 4 or greater and Scopolamine patch - Pre-op, Midazolam, Dexamethasone, Ondansetron and Treatment may vary due to age or medical condition  Airway Management Planned: Oral ETT  Additional Equipment:   Intra-op Plan:   Post-operative Plan: Extubation in OR  Informed Consent: I have reviewed the patients History and Physical, chart, labs and discussed the procedure including the risks, benefits and alternatives for the proposed anesthesia with the patient or authorized representative who has indicated his/her understanding and acceptance.   Dental advisory given  Plan Discussed with: CRNA and Surgeon  Anesthesia Plan Comments:         Anesthesia Quick Evaluation

## 2018-06-03 ENCOUNTER — Encounter (HOSPITAL_COMMUNITY): Payer: Self-pay | Admitting: Obstetrics and Gynecology

## 2018-06-03 ENCOUNTER — Emergency Department (HOSPITAL_COMMUNITY)
Admission: EM | Admit: 2018-06-03 | Discharge: 2018-06-03 | Disposition: A | Discharge status: Home / Self Care | Payer: Medicaid Other | Attending: Emergency Medicine | Admitting: Emergency Medicine

## 2018-06-03 DIAGNOSIS — N390 Urinary tract infection, site not specified: Secondary | ICD-10-CM | POA: Diagnosis not present

## 2018-06-03 DIAGNOSIS — Z87891 Personal history of nicotine dependence: Secondary | ICD-10-CM | POA: Insufficient documentation

## 2018-06-03 DIAGNOSIS — Z79899 Other long term (current) drug therapy: Secondary | ICD-10-CM | POA: Insufficient documentation

## 2018-06-03 DIAGNOSIS — R55 Syncope and collapse: Secondary | ICD-10-CM | POA: Diagnosis not present

## 2018-06-03 LAB — URINALYSIS, ROUTINE W REFLEX MICROSCOPIC
Bilirubin Urine: NEGATIVE
GLUCOSE, UA: NEGATIVE mg/dL
Ketones, ur: NEGATIVE mg/dL
Nitrite: NEGATIVE
PROTEIN: 100 mg/dL — AB
RBC / HPF: >50 RBC/hpf — ABNORMAL HIGH (ref 0–5)
Specific Gravity, Urine: 1.029 (ref 1.005–1.030)
pH: 6 (ref 5.0–8.0)

## 2018-06-03 LAB — BASIC METABOLIC PANEL
Anion gap: 7 (ref 5–15)
BUN: 21 mg/dL — ABNORMAL HIGH (ref 6–20)
CALCIUM: 9.1 mg/dL (ref 8.9–10.3)
CO2: 26 mmol/L (ref 22–32)
CREATININE: 0.9 mg/dL (ref 0.44–1.00)
Chloride: 108 mmol/L (ref 98–111)
GFR calc Af Amer: >60 mL/min (ref >60)
GFR calc non Af Amer: >60 mL/min (ref >60)
GLUCOSE: 111 mg/dL — AB (ref 70–99)
Potassium: 3.6 mmol/L (ref 3.5–5.1)
Sodium: 141 mmol/L (ref 135–145)

## 2018-06-03 LAB — CBC
HCT: 41.1 % (ref 36.0–46.0)
HEMOGLOBIN: 13.7 g/dL (ref 12.0–15.0)
MCH: 32.9 pg (ref 26.0–34.0)
MCHC: 33.3 g/dL (ref 30.0–36.0)
MCV: 98.8 fL (ref 78.0–100.0)
Platelets: 231 10*3/uL (ref 150–400)
RBC: 4.16 MIL/uL (ref 3.87–5.11)
RDW: 11.9 % (ref 11.5–15.5)
WBC: 12.7 10*3/uL — ABNORMAL HIGH (ref 4.0–10.5)

## 2018-06-03 LAB — CBG MONITORING, ED: GLUCOSE-CAPILLARY: 100 mg/dL — AB (ref 70–99)

## 2018-06-03 MED ORDER — CEPHALEXIN 500 MG PO CAPS: 500.0000 mg | ORAL_CAPSULE | Freq: Two times a day (BID) | ORAL | 0 refills | Status: AC

## 2018-06-03 MED ORDER — OXYCODONE-ACETAMINOPHEN 5-325 MG PO TABS
1.0000 | ORAL_TABLET | Freq: Once | ORAL | Status: AC
  Administered 2018-06-03: 1 via ORAL
  Filled 2018-06-03: qty 1

## 2018-06-03 NOTE — Discharge Instructions (Addendum)
You must follow-up with your primary care provider regarding your visit today for further evaluation as soon as possible. Please take all of your antibiotic, Keflex as prescribed. Please return to the emergency department for any new or worsening symptoms or if you pass out again.  Get help right away if: You have a very bad headache. You have unusual pain in your chest, tummy, or back. You are bleeding from your mouth or rectum. You have black or tarry poop (stool). You have a very fast or uneven heartbeat (palpitations). It hurts to breathe. You pass out once or more than once. You have jerky movements that you cannot control (seizure). You are confused. You have trouble walking. You are very weak. You have vision problems.

## 2018-06-03 NOTE — ED Provider Notes (Signed)
[facility] Provider Note   CSN: [id number] Arrival date & time: 06/03/18  1002     History   Chief Complaint Chief Complaint  Patient presents with  . Seizures  . Loss of Consciousness    HPI [name] is a 27 y.o. female presenting for syncopal episode this morning with seizure-like activity reported by her friend.  Patient states that she was walking around her house this morning when she became dizzy, she states she told her friend this and went to go lay on her bed.  Patient states that she then lost consciousness.  Patient's friend who is in room reports that patient fell onto her bed, and curled her hands to her chest and started kicking her feet.  Patient's friend reports that this lasted for 3 minutes and the patient was grunting during this time.  When patient awoke friend reports that she asked what had happened.  Patient denies confusion, no postictal state reported patient was immediately talking after she awoke and states she realized what had happened.  Patient fully ambulatory and alert and oriented upon awaking from her episode.  Patient denies history of seizures.  Of note patient had tubal ligations and LEEP procedure performed yesterday.  Patient denies complication from the procedure, states that it went well.  Denies fever, nausea, vomiting, diarrhea, dysuria.  Patient endorses mild abdominal pain which is tender to palpation, she has been taking her oxycodone as prescribed from her surgeon with relief.  Chart Reviewed: Of note patient with NEGATIVE urine pregnancy test yesterday at [facility]. - 06/02/2018 14:00   Past Medical History:  Diagnosis Date  . Anxiety   . Depression   . Headache    migraines  . Heart murmur    as a child  . Herpes    no outbreaks since 5 years ago  . Pregnancy     Patient Active Problem List   Diagnosis Date Noted  . Severe cervical dysplasia 06/02/2018  . Encounter for  sterilization 06/02/2018  . Postpartum hemorrhage 10/04/2015  . Cervical laceration 10/04/2015  . 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 Intrauterine normal pregnancy 10/03/2015  . Labor, prolonged latent phase 02/10/2014  . Supervision of other normal pregnancy 02/09/2014    Past Surgical History:  Procedure Laterality Date  . DILATION AND EVACUATION N/A 10/04/2015   Procedure: DILATATION AND EVACUATION;  Surgeon: Myna Hidalgo, DO;  Location: [facility];  Service: Gynecology;  Laterality: N/A;  . LAPAROSCOPIC TUBAL LIGATION Bilateral 06/02/2018   Procedure: LAPAROSCOPIC TUBAL LIGATION;  Surgeon: Gerald Leitz, MD;  Location: [facility];  Service: Gynecology;  Laterality: Bilateral;  . LEEP N/A 06/02/2018   Procedure: LOOP ELECTROSURGICAL EXCISION PROCEDURE (LEEP);  Surgeon: Gerald Leitz, MD;  Location: [facility];  Service: Gynecology;  Laterality: N/A;  . NO PAST SURGERIES    . WISDOM TOOTH EXTRACTION  2017     OB History    Gravida  4   Para  3   Term  3   Preterm  0   AB  0   Living  3     SAB  0   TAB  0   Ectopic  0   Multiple  0   Live Births  3        Obstetric Comments  2009 39 week baby 12 hr labor 2015 39 weeks baby 4 hr labor         Home Medications    Prior to Admission medications  Medication Sig Start Date End Date Taking? Authorizing Provider  aspirin-acetaminophen-caffeine (EXCEDRIN EXTRA STRENGTH) [phone] MG tablet Take 1-2 tablets by mouth 2 (two) times daily as needed for headache.   Yes [provider]  Cholecalciferol (VITAMIN D) 2000 units tablet Take 2,000 Units by mouth every evening.   Yes [provider]  ferrous sulfate 325 (65 FE) MG tablet Take 1 tablet (325 mg total) by mouth 2 (two) times daily with a meal. Patient taking differently: Take 325 mg by mouth every evening.  10/06/15  Yes Myna Hidalgo, DO  Multiple Vitamin (MULTIVITAMIN WITH MINERALS) TABS tablet Take 1 tablet by mouth every evening.   Yes [provider]   oxyCODONE (OXY IR/ROXICODONE) 5 MG immediate release tablet Take 1 tablet (5 mg total) by mouth every 6 (six) hours as needed for severe pain. 06/02/18  Yes Gerald Leitz, MD  sertraline (ZOLOFT) 25 MG tablet Take 1 tablet (25 mg total) by mouth daily. Patient taking differently: Take 25 mg by mouth at bedtime as needed (anxiety).  10/06/15  Yes Myna Hidalgo, DO  cephALEXin (KEFLEX) 500 MG capsule Take 1 capsule (500 mg total) by mouth 2 (two) times daily for 5 days. 06/03/18 06/08/18  Harlene Salts A, PA-C  ibuprofen (ADVIL,MOTRIN) 800 MG tablet Take 1 tablet (800 mg total) by mouth every 8 (eight) hours as needed. Patient not taking: Reported on 06/03/2018 06/02/18   Gerald Leitz, MD    Family History Family History  Problem Relation Age of Onset  . Diabetes Mother   . Hypertension Mother     Social History Social History   Tobacco Use  . Smoking status: Former Smoker    Types: Cigarettes  . Smokeless tobacco: Never Used  . Tobacco comment: smoked in high school   Substance Use Topics  . Alcohol use: Yes    Comment: occ before preg  . Drug use: Yes    Types: Other-see comments, Marijuana    Comment: none since being pregnant occasional     Allergies   Patient has no known allergies.   Review of Systems Review of Systems  Constitutional: Negative.  Negative for chills, fatigue and fever.  HENT: Positive for sore throat. Negative for congestion, ear pain, rhinorrhea and trouble swallowing.   Eyes: Negative.  Negative for visual disturbance.  Respiratory: Negative.  Negative for cough, chest tightness and shortness of breath.   Cardiovascular: Negative.  Negative for chest pain and leg swelling.  Gastrointestinal: Positive for abdominal pain. Negative for blood in stool, diarrhea, nausea and vomiting.  Genitourinary: Negative.  Negative for dysuria, flank pain and hematuria.  Musculoskeletal: Negative.  Negative for arthralgias, myalgias and neck pain.  Skin: Negative.   Negative for rash.  Neurological: Positive for dizziness and syncope. Negative for weakness, light-headedness and headaches.    Physical Exam Updated Vital Signs BP 114/70 (BP Location: Right Arm)   Pulse 64   Temp 98.8 F (37.1 C) (Oral)   Resp 17   LMP 06/02/2018   SpO2 100%   Physical Exam  Constitutional: She is oriented to person, place, and time. She appears well-developed and well-nourished.  HENT:  Head: Normocephalic and atraumatic.  Right Ear: External ear normal.  Left Ear: External ear normal.  Nose: Nose normal.  Mouth/Throat: Uvula is midline. No uvula swelling. No oropharyngeal exudate or posterior oropharyngeal edema.  Patient with mild erythema and petechia to the posterior oropharynx, patient endorses sore throat, denies recent episodes of vomiting.  Eyes: Pupils are equal, round,  and reactive to light. Conjunctivae and EOM are normal.  Neck: Normal range of motion. Neck supple. No JVD present. No tracheal deviation present.  Cardiovascular: Normal rate, regular rhythm, normal heart sounds and intact distal pulses.  Pulmonary/Chest: Effort normal and breath sounds normal. No respiratory distress. She has no wheezes.  Abdominal: Soft. Bowel sounds are normal. There is no tenderness. There is no rigidity, no rebound, no guarding, no CVA tenderness, no tenderness at McBurney's point and negative Murphy's sign.    Musculoskeletal: Normal range of motion. She exhibits no edema, tenderness or deformity.  Neurological: She is alert and oriented to person, place, and time. She has normal strength. No cranial nerve deficit or sensory deficit. She displays a negative Romberg sign.  Mental Status:  Alert, oriented, thought content appropriate, able to give a coherent history. Speech fluent without evidence of aphasia. Able to follow 2 step commands without difficulty.  Cranial Nerves:  II:  Peripheral visual fields grossly normal, pupils equal, round, reactive to  light III,IV, VI: ptosis not present, extra-ocular motions intact bilaterally  V,VII: smile symmetric, eyebrows raise symmetric, facial light touch sensation equal VIII: hearing grossly normal to voice  X: uvula elevates symmetrically  XI: bilateral shoulder shrug symmetric and strong XII: midline tongue extension without fassiculations Motor:  Normal tone. 5/5 in upper and lower extremities bilaterally including strong and equal grip strength and dorsiflexion/plantar flexion Sensory: Sensation intact to light touch in all extremities. Negative Romberg.  Cerebellar: normal finger-to-nose with bilateral upper extremities. Normal heel-to -shin balance bilaterally of the lower extremity. No pronator drift.  Gait: normal gait and balance CV: distal pulses palpable throughout   Skin: Skin is warm and dry. Capillary refill takes less than 2 seconds.  Psychiatric: She has a normal mood and affect. Her behavior is normal.     ED Treatments / Results  Labs (all labs ordered are listed, but only abnormal results are displayed) Labs Reviewed  BASIC METABOLIC PANEL - Abnormal; Notable for the following components:      Result Value   Glucose, Bld 111 (*)    BUN 21 (*)    All other components within normal limits  CBC - Abnormal; Notable for the following components:   WBC 12.7 (*)    All other components within normal limits  URINALYSIS, ROUTINE W REFLEX MICROSCOPIC - Abnormal; Notable for the following components:   Color, Urine RED (*)    APPearance HAZY (*)    Hgb urine dipstick LARGE (*)    Protein, ur 100 (*)    Leukocytes, UA SMALL (*)    RBC / HPF >50 (*)    Bacteria, UA FEW (*)    All other components within normal limits  CBG MONITORING, ED - Abnormal; Notable for the following components:   Glucose-Capillary 100 (*)    All other components within normal limits  URINE CULTURE    EKG EKG Interpretation  Date/Time:  Thursday June 03 2018 10:21:26 EDT Ventricular Rate:   66 PR Interval:    QRS Duration: 96 QT Interval:  397 QTC Calculation: 416 R Axis:   66 Text Interpretation:  Sinus rhythm Confirmed by Donnetta Hutching ([id number]) on 06/03/2018 1:24:57 PM   Radiology No results found.  Procedures Procedures (including critical care time)  Medications Ordered in ED Medications  oxyCODONE-acetaminophen (PERCOCET/ROXICET) 5-325 MG per tablet 1 tablet (1 tablet Oral Given 06/03/18 1253)     Initial Impression / Assessment and Plan / ED Course  I have reviewed the  triage vital signs and the nursing notes.  Pertinent labs & imaging results that were available during my care of the patient were reviewed by me and considered in my medical decision making (see chart for details).    [name] is a 27 y.o. female who presents to ED for evaluation of syncopal episode this morning.  On exam, patient is afebrile, hemodynamically stable with no focal neuro deficits and benign cardiopulmonary exam. EKG reviewed by Dr. Adriana Simasook. Patient with no personal or family history of sudden, young cardiac death. No hx of CHF. No complaints of chest pain or shortness of breath. Low risk per [facility] Syncope Rule.   Orthostatic vital signs within normal limits.  There is doubt that the patient experienced a seizure today, there was no postictal state.  I have encouraged patient to follow-up with her primary care provider for further evaluation.  Chart reviewed, [facility].  Patient's abdominal pain consistent with surgical pain from her procedure yesterday, pain is not out of proportion.  No signs of abdominal bleeding, no ecchymosis, no Grey Turner or Cullen's sign, no drainage, no rebound tenderness.  Patient without dysuria however urinalysis suggests that patient has a possible urinary tract infection.  I have prescribed the patient a 5-day course of Keflex to treat this.  I have also sent the urine off for  culture.  At this time there does not appear to be any evidence of an acute emergency medical condition and the patient appears stable for discharge with appropriate outpatient follow up. Diagnosis was discussed with patient who verbalizes understanding and is agreeable to discharge. I have discussed return precautions with patient who verbalizes understanding of return precautions. Patient strongly encouraged to follow-up with their PCP.  Patient's case discussed with and patient seen by Dr. Adriana Simasook who agrees with plan to discharge with follow-up.     This note was dictated using DragonOne dictation software; please contact for any inconsistencies within the note.      Final Clinical Impressions(s) / ED Diagnoses   Final diagnoses:  Syncope and collapse  Lower urinary tract infectious disease    ED Discharge Orders        Ordered    cephALEXin (KEFLEX) 500 MG capsule  2 times daily     06/03/18 1409       Elizabeth PalauMorelli, Haylee Mcanany A, PA-C 06/03/18 1451    Donnetta Hutchingook, Brian, MD 06/06/18 1008

## 2018-06-03 NOTE — ED Triage Notes (Signed)
Pt reports that [facility] and this morning she got dizzy and past out. Friend repots that pt was drawn up and shaking and unable to breath so she turned pt on her side and tried relaxing patient, Reports lasted around 3 minutes.

## 2018-06-05 LAB — URINE CULTURE

## 2018-06-06 ENCOUNTER — Telehealth: Payer: Self-pay

## 2018-06-06 NOTE — Telephone Encounter (Signed)
Post ED Visit - Positive Culture Follow-up  Culture report reviewed by antimicrobial stewardship pharmacist:  []  Enzo BiNathan Batchelder, Pharm.D. []  Celedonio MiyamotoJeremy Frens, Pharm.D., BCPS AQ-ID [x]  Garvin FilaMike Maccia, Pharm.D., BCPS []  Georgina PillionElizabeth Martin, Pharm.D., BCPS []  TremontMinh Pham, [address] BoulevardPharm.D., BCPS, AAHIVP []  Estella HuskMichelle Turner, Pharm.D., BCPS, AAHIVP []  Lysle Pearlachel Rumbarger, PharmD, BCPS []  Phillips Climeshuy Dang, PharmD, BCPS []  Agapito GamesAlison Masters, PharmD, BCPS []  Verlan FriendsErin Deja, PharmD  Positive urine culture Treated with Cephalexin, organism sensitive to the same and no further patient follow-up is required at this time.  Jerry CarasCullom, Seraphim Affinito Burnett 06/06/2018, 9:39 AM

## 2018-08-04 ENCOUNTER — Other Ambulatory Visit: Payer: Self-pay

## 2018-08-04 ENCOUNTER — Encounter (HOSPITAL_COMMUNITY): Payer: Self-pay | Admitting: Emergency Medicine

## 2018-08-04 ENCOUNTER — Emergency Department (HOSPITAL_COMMUNITY)
Admission: EM | Admit: 2018-08-04 | Discharge: 2018-08-04 | Disposition: A | Discharge status: Home / Self Care | Payer: Medicaid Other | Attending: Emergency Medicine | Admitting: Emergency Medicine

## 2018-08-04 DIAGNOSIS — R112 Nausea with vomiting, unspecified: Secondary | ICD-10-CM | POA: Diagnosis not present

## 2018-08-04 DIAGNOSIS — Z87891 Personal history of nicotine dependence: Secondary | ICD-10-CM | POA: Insufficient documentation

## 2018-08-04 DIAGNOSIS — Z79899 Other long term (current) drug therapy: Secondary | ICD-10-CM | POA: Diagnosis not present

## 2018-08-04 DIAGNOSIS — N179 Acute kidney failure, unspecified: Secondary | ICD-10-CM | POA: Diagnosis not present

## 2018-08-04 DIAGNOSIS — E860 Dehydration: Secondary | ICD-10-CM

## 2018-08-04 DIAGNOSIS — R197 Diarrhea, unspecified: Secondary | ICD-10-CM | POA: Diagnosis not present

## 2018-08-04 DIAGNOSIS — Z7982 Long term (current) use of aspirin: Secondary | ICD-10-CM | POA: Insufficient documentation

## 2018-08-04 LAB — URINALYSIS, ROUTINE W REFLEX MICROSCOPIC
BACTERIA UA: NONE SEEN
BILIRUBIN URINE: NEGATIVE
Glucose, UA: NEGATIVE mg/dL
KETONES UR: 20 mg/dL — AB
LEUKOCYTES UA: NEGATIVE
NITRITE: NEGATIVE
PROTEIN: NEGATIVE mg/dL
Specific Gravity, Urine: 1.012 (ref 1.005–1.030)
pH: 9 — ABNORMAL HIGH (ref 5.0–8.0)

## 2018-08-04 LAB — COMPREHENSIVE METABOLIC PANEL
ALBUMIN: 4.4 g/dL (ref 3.5–5.0)
ALT: 19 U/L (ref 0–44)
ALT: 17 U/L (ref 0–44)
ANION GAP: 18 — AB (ref 5–15)
ANION GAP: 8 (ref 5–15)
AST: 32 U/L (ref 15–41)
AST: 24 U/L (ref 15–41)
Albumin: 3.6 g/dL (ref 3.5–5.0)
Alkaline Phosphatase: 44 U/L (ref 38–126)
Alkaline Phosphatase: 39 U/L (ref 38–126)
BILIRUBIN TOTAL: 1.4 mg/dL — AB (ref 0.3–1.2)
BILIRUBIN TOTAL: 1.1 mg/dL (ref 0.3–1.2)
BUN: 14 mg/dL (ref 6–20)
BUN: 13 mg/dL (ref 6–20)
CHLORIDE: 106 mmol/L (ref 98–111)
CO2: 18 mmol/L — ABNORMAL LOW (ref 22–32)
CO2: 22 mmol/L (ref 22–32)
Calcium: 10.4 mg/dL — ABNORMAL HIGH (ref 8.9–10.3)
Calcium: 8.5 mg/dL — ABNORMAL LOW (ref 8.9–10.3)
Chloride: 111 mmol/L (ref 98–111)
Creatinine, Ser: 1.45 mg/dL — ABNORMAL HIGH (ref 0.44–1.00)
Creatinine, Ser: 1.17 mg/dL — ABNORMAL HIGH (ref 0.44–1.00)
GFR calc Af Amer: 56 mL/min — ABNORMAL LOW (ref >60)
GFR calc non Af Amer: 48 mL/min — ABNORMAL LOW (ref >60)
GFR calc non Af Amer: >60 mL/min (ref >60)
GLUCOSE: 149 mg/dL — AB (ref 70–99)
Glucose, Bld: 149 mg/dL — ABNORMAL HIGH (ref 70–99)
POTASSIUM: 3.4 mmol/L — AB (ref 3.5–5.1)
POTASSIUM: 3.9 mmol/L (ref 3.5–5.1)
Sodium: 142 mmol/L (ref 135–145)
Sodium: 141 mmol/L (ref 135–145)
TOTAL PROTEIN: 7.8 g/dL (ref 6.5–8.1)
TOTAL PROTEIN: 6.6 g/dL (ref 6.5–8.1)

## 2018-08-04 LAB — CBC
HEMATOCRIT: 43.3 % (ref 36.0–46.0)
HEMOGLOBIN: 14.3 g/dL (ref 12.0–15.0)
MCH: 32.1 pg (ref 26.0–34.0)
MCHC: 33 g/dL (ref 30.0–36.0)
MCV: 97.3 fL (ref 78.0–100.0)
Platelets: 242 10*3/uL (ref 150–400)
RBC: 4.45 MIL/uL (ref 3.87–5.11)
RDW: 10.9 % — ABNORMAL LOW (ref 11.5–15.5)
WBC: 8.9 10*3/uL (ref 4.0–10.5)

## 2018-08-04 LAB — I-STAT BETA HCG BLOOD, ED (MC, WL, AP ONLY)

## 2018-08-04 LAB — LIPASE, BLOOD: Lipase: 27 U/L (ref 11–51)

## 2018-08-04 MED ORDER — ONDANSETRON 4 MG PO TBDP: 4.0000 mg | ORAL_TABLET | Freq: Three times a day (TID) | ORAL | 0 refills | Status: AC | PRN

## 2018-08-04 MED ORDER — METOCLOPRAMIDE HCL 5 MG/ML IJ SOLN
10.0000 mg | Freq: Once | INTRAMUSCULAR | Status: AC
  Administered 2018-08-04: 10 mg via INTRAVENOUS
  Filled 2018-08-04: qty 2

## 2018-08-04 MED ORDER — SODIUM CHLORIDE 0.9 % IV BOLUS
1000.0000 mL | Freq: Once | INTRAVENOUS | Status: AC
  Administered 2018-08-04: 1000 mL via INTRAVENOUS

## 2018-08-04 MED ORDER — SODIUM CHLORIDE 0.9 % IV BOLUS
500.0000 mL | Freq: Once | INTRAVENOUS | Status: AC
  Administered 2018-08-04: 500 mL via INTRAVENOUS

## 2018-08-04 NOTE — ED Triage Notes (Signed)
BIB [facility] with c/o of N/V/D that started at 2200 Tuesday. Pt also reports tingling in arms and legs. Afebrile on arrival.

## 2018-08-04 NOTE — ED Notes (Signed)
Pt is talking on the phone and tolerating ginger ale.

## 2018-08-04 NOTE — ED Provider Notes (Signed)
[facility] EMERGENCY DEPARTMENT Provider Note  CSN: [id number] Arrival date & time: 08/04/18 0447  Chief Complaint(s) Emesis  HPI Manvir Thorson is a 27 y.o. female who presents to the emergency department with numerous episodes of nonbloody nonbilious emesis and small amount of diarrhea that began 7 hours prior to arrival.  No alleviating or aggravating factors.  Patient denies any fevers, abdominal pain, dysuria, chest pain, shortness of breath.  She denies any suspicious food intake.  Denies any known sick contacts.  Admits to marijuana use.  Denied any alcohol or other illicit drug use.  HPI  Past Medical History Past Medical History:  Diagnosis Date  . Anxiety   . Depression   . Headache    migraines  . Heart murmur    as a child  . Herpes    no outbreaks since 5 years ago  . Pregnancy    Patient Active Problem List   Diagnosis Date Noted  . Severe cervical dysplasia 06/02/2018  . Encounter for sterilization 06/02/2018  . Postpartum hemorrhage 10/04/2015  . Cervical laceration 10/04/2015  . 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 Intrauterine normal pregnancy 10/03/2015  . Labor, prolonged latent phase 02/10/2014  . Supervision of other normal pregnancy 02/09/2014   Home Medication(s) Prior to Admission medications   Medication Sig Start Date End Date Taking? Authorizing Provider  aspirin-acetaminophen-caffeine (EXCEDRIN EXTRA STRENGTH) [phone] MG tablet Take 1-2 tablets by mouth 2 (two) times daily as needed for headache.    [provider]  Cholecalciferol (VITAMIN D) 2000 units tablet Take 2,000 Units by mouth every evening.    [provider]  ferrous sulfate 325 (65 FE) MG tablet Take 1 tablet (325 mg total) by mouth 2 (two) times daily with a meal. Patient taking differently: Take 325 mg by mouth every evening.  10/06/15   Myna Hidalgo, DO  ibuprofen (ADVIL,MOTRIN) 800 MG tablet Take 1 tablet (800 mg total) by mouth every 8  (eight) hours as needed. Patient not taking: Reported on 06/03/2018 06/02/18   Gerald Leitz, MD  Multiple Vitamin (MULTIVITAMIN WITH MINERALS) TABS tablet Take 1 tablet by mouth every evening.    [provider]  oxyCODONE (OXY IR/ROXICODONE) 5 MG immediate release tablet Take 1 tablet (5 mg total) by mouth every 6 (six) hours as needed for severe pain. 06/02/18   Gerald Leitz, MD  sertraline (ZOLOFT) 25 MG tablet Take 1 tablet (25 mg total) by mouth daily. Patient taking differently: Take 25 mg by mouth at bedtime as needed (anxiety).  10/06/15   Myna Hidalgo, DO                                                                                                                                    Past Surgical History Past Surgical History:  Procedure Laterality Date  . DILATION AND EVACUATION N/A 10/04/2015   Procedure: DILATATION AND EVACUATION;  Surgeon: Myna Hidalgo, DO;  Location: [facility];  Service: Gynecology;  Laterality: N/A;  . LAPAROSCOPIC TUBAL LIGATION Bilateral 06/02/2018   Procedure: LAPAROSCOPIC TUBAL LIGATION;  Surgeon: Gerald Leitz, MD;  Location: [facility];  Service: Gynecology;  Laterality: Bilateral;  . LEEP N/A 06/02/2018   Procedure: LOOP ELECTROSURGICAL EXCISION PROCEDURE (LEEP);  Surgeon: Gerald Leitz, MD;  Location: [facility];  Service: Gynecology;  Laterality: N/A;  . NO PAST SURGERIES    . WISDOM TOOTH EXTRACTION  2017   Family History Family History  Problem Relation Age of Onset  . Diabetes Mother   . Hypertension Mother     Social History Social History   Tobacco Use  . Smoking status: Former Smoker    Types: Cigarettes  . Smokeless tobacco: Never Used  . Tobacco comment: smoked in high school   Substance Use Topics  . Alcohol use: Yes    Comment: occ before preg  . Drug use: Yes    Types: Other-see comments, Marijuana    Comment: none since being pregnant occasional   Allergies Patient has no known allergies.  Review of Systems Review of Systems All  other systems are reviewed and are negative for acute change except as noted in the HPI  Physical Exam Vital Signs  I have reviewed the triage vital signs BP 120/69 (BP Location: Right Arm)   Pulse 61   Temp 98.1 F (36.7 C) (Oral)   Resp 16   Ht 5\' 4"  (1.626 m)   Wt 59 kg   SpO2 100%   BMI 22.31 kg/m   Physical Exam  Constitutional: She is oriented to person, place, and time. She appears well-developed and well-nourished. No distress.  HENT:  Head: Normocephalic and atraumatic.  Right Ear: External ear normal.  Left Ear: External ear normal.  Nose: Nose normal.  Eyes: Conjunctivae and EOM are normal. No scleral icterus.  Neck: Normal range of motion and phonation normal.  Cardiovascular: Normal rate and regular rhythm.  Pulmonary/Chest: Effort normal. No stridor. No respiratory distress.  Abdominal: She exhibits no distension. There is no tenderness. There is no rigidity, no rebound and no guarding.  Musculoskeletal: Normal range of motion. She exhibits no edema.  Neurological: She is alert and oriented to person, place, and time.  Skin: She is not diaphoretic.  Psychiatric: She has a normal mood and affect. Her behavior is normal.  Vitals reviewed.   ED Results and Treatments Labs (all labs ordered are listed, but only abnormal results are displayed) Labs Reviewed  COMPREHENSIVE METABOLIC PANEL - Abnormal; Notable for the following components:      Result Value   Potassium 3.4 (*)    CO2 18 (*)    Glucose, Bld 149 (*)    Creatinine, Ser 1.45 (*)    Calcium 10.4 (*)    Total Bilirubin 1.4 (*)    GFR calc non Af Amer 48 (*)    GFR calc Af Amer 56 (*)    Anion gap 18 (*)    All other components within normal limits  CBC - Abnormal; Notable for the following components:   RDW 10.9 (*)    All other components within normal limits  URINALYSIS, ROUTINE W REFLEX MICROSCOPIC - Abnormal; Notable for the following components:   pH 9.0 (*)    Hgb urine dipstick MODERATE  (*)    Ketones, ur 20 (*)    All other components within normal limits  LIPASE, BLOOD  I-STAT BETA HCG BLOOD, ED ([facility], [facility], [facility] ONLY)  EKG  EKG Interpretation  Date/Time:    Ventricular Rate:    PR Interval:    QRS Duration:   QT Interval:    QTC Calculation:   R Axis:     Text Interpretation:        Radiology No results found. Pertinent labs & imaging results that were available during my care of the patient were reviewed by me and considered in my medical decision making (see chart for details).  Medications Ordered in ED Medications  sodium chloride 0.9 % bolus 500 mL (has no administration in time range)  sodium chloride 0.9 % bolus 1,000 mL (0 mLs Intravenous Stopped 08/04/18 0648)  metoCLOPramide (REGLAN) injection 10 mg (10 mg Intravenous Given 08/04/18 0530)                                                                                                                                    Procedures Procedures  (including critical care time)  Medical Decision Making / ED Course I have reviewed the nursing notes for this encounter and the patient's prior records (if available in EHR or on provided paperwork).    Several hours of nausea, vomiting and diarrhea.  Patient is afebrile with stable vital signs. Abdomen benign. Possible viral vs THC related.  Patient denied any illicit drug use or ingestion.  Labs w/o leukocytosis or anemia.  Chemistry panel with renal insufficiency and metabolic acidosis likely dehydration.  No evidence of biliary obstruction or pancreatitis.  No evidence of DKA.  hCG negative.  UA without evidence of infection.  Treated symptomatically with IV fluids and nausea medication.  Able to tolerate oral hydration.  We will repeat metabolic panel to assess for improvement of acidosis.  Patient care turned over to Dr Patria Maneampos at 0730.  Patient case and results discussed in detail; please see their note for further ED managment.     Final Clinical Impression(s) / ED Diagnoses Final diagnoses:  Nausea vomiting and diarrhea  AKI (acute kidney injury) (HCC)      This chart was dictated using voice recognition software.  Despite best efforts to proofread,  errors can occur which can change the documentation meaning.   Nira Connardama, Riot Waterworth Eduardo, MD 08/04/18 [id number]

## 2018-10-26 ENCOUNTER — Other Ambulatory Visit: Payer: Self-pay | Admitting: Obstetrics and Gynecology

## 2018-10-26 ENCOUNTER — Other Ambulatory Visit (HOSPITAL_COMMUNITY)
Admission: RE | Admit: 2018-10-26 | Discharge: 2018-10-26 | Disposition: A | Discharge status: Home / Self Care | Payer: Medicaid Other | Source: Ambulatory Visit | Attending: Obstetrics and Gynecology | Admitting: Obstetrics and Gynecology

## 2018-10-26 DIAGNOSIS — D069 Carcinoma in situ of cervix, unspecified: Secondary | ICD-10-CM | POA: Insufficient documentation

## 2018-11-02 LAB — CYTOLOGY - PAP
Diagnosis: UNDETERMINED — AB
HPV (WINDOPATH): NOT DETECTED

## 2019-03-15 ENCOUNTER — Other Ambulatory Visit (HOSPITAL_COMMUNITY)
Admission: RE | Admit: 2019-03-15 | Discharge: 2019-03-15 | Disposition: A | Discharge status: Home / Self Care | Payer: Medicaid Other | Source: Ambulatory Visit | Attending: Obstetrics and Gynecology | Admitting: Obstetrics and Gynecology

## 2019-03-15 ENCOUNTER — Other Ambulatory Visit: Payer: Self-pay | Admitting: Obstetrics and Gynecology

## 2019-03-15 DIAGNOSIS — Z124 Encounter for screening for malignant neoplasm of cervix: Secondary | ICD-10-CM | POA: Insufficient documentation

## 2019-03-18 LAB — CYTOLOGY - PAP
Chlamydia: NEGATIVE
Diagnosis: UNDETERMINED — AB
HPV: DETECTED — AB
Neisseria Gonorrhea: NEGATIVE

## 2019-05-31 ENCOUNTER — Telehealth (HOSPITAL_COMMUNITY): Payer: Self-pay

## 2019-05-31 NOTE — Telephone Encounter (Signed)
Telephone patient at home number on 04/27/2019.  Left voice message with info to call [facility] and schedule appointment.

## 2019-06-08 ENCOUNTER — Other Ambulatory Visit: Payer: Self-pay | Admitting: Obstetrics and Gynecology

## 2019-09-06 ENCOUNTER — Other Ambulatory Visit (HOSPITAL_COMMUNITY)
Admission: RE | Admit: 2019-09-06 | Discharge: 2019-09-06 | Disposition: A | Discharge status: Home / Self Care | Payer: Medicaid Other | Source: Ambulatory Visit | Attending: Obstetrics and Gynecology | Admitting: Obstetrics and Gynecology

## 2019-09-06 ENCOUNTER — Other Ambulatory Visit: Payer: Self-pay | Admitting: Obstetrics and Gynecology

## 2019-09-06 DIAGNOSIS — Z01419 Encounter for gynecological examination (general) (routine) without abnormal findings: Secondary | ICD-10-CM | POA: Diagnosis present

## 2019-09-08 LAB — CYTOLOGY - PAP
Chlamydia: NEGATIVE
Comment: NEGATIVE
Comment: NORMAL
Diagnosis: NEGATIVE
Neisseria Gonorrhea: NEGATIVE

## 2020-11-13 ENCOUNTER — Emergency Department (HOSPITAL_COMMUNITY)
Admission: EM | Admit: 2020-11-13 | Discharge: 2020-11-13 | Disposition: A | Discharge status: Home / Self Care | Payer: Medicaid Other | Attending: Emergency Medicine | Admitting: Emergency Medicine

## 2020-11-13 ENCOUNTER — Emergency Department (HOSPITAL_COMMUNITY): Payer: Self-pay

## 2020-11-13 ENCOUNTER — Encounter (HOSPITAL_COMMUNITY): Payer: Self-pay

## 2020-11-13 ENCOUNTER — Other Ambulatory Visit: Payer: Self-pay

## 2020-11-13 ENCOUNTER — Emergency Department (HOSPITAL_COMMUNITY)
Admission: EM | Admit: 2020-11-13 | Discharge: 2020-11-13 | Disposition: A | Discharge status: Home / Self Care | Payer: Self-pay | Attending: Emergency Medicine | Admitting: Emergency Medicine

## 2020-11-13 DIAGNOSIS — Z87891 Personal history of nicotine dependence: Secondary | ICD-10-CM | POA: Insufficient documentation

## 2020-11-13 DIAGNOSIS — R55 Syncope and collapse: Secondary | ICD-10-CM | POA: Insufficient documentation

## 2020-11-13 DIAGNOSIS — E860 Dehydration: Secondary | ICD-10-CM | POA: Insufficient documentation

## 2020-11-13 DIAGNOSIS — Z5321 Procedure and treatment not carried out due to patient leaving prior to being seen by health care provider: Secondary | ICD-10-CM | POA: Insufficient documentation

## 2020-11-13 DIAGNOSIS — F1099 Alcohol use, unspecified with unspecified alcohol-induced disorder: Secondary | ICD-10-CM | POA: Insufficient documentation

## 2020-11-13 DIAGNOSIS — Z789 Other specified health status: Secondary | ICD-10-CM

## 2020-11-13 DIAGNOSIS — F1210 Cannabis abuse, uncomplicated: Secondary | ICD-10-CM | POA: Insufficient documentation

## 2020-11-13 DIAGNOSIS — Z79899 Other long term (current) drug therapy: Secondary | ICD-10-CM | POA: Insufficient documentation

## 2020-11-13 LAB — URINALYSIS, ROUTINE W REFLEX MICROSCOPIC
Bacteria, UA: NONE SEEN
Bilirubin Urine: NEGATIVE
Glucose, UA: NEGATIVE mg/dL
Ketones, ur: 5 mg/dL — AB
Nitrite: NEGATIVE
Protein, ur: NEGATIVE mg/dL
RBC / HPF: >50 RBC/hpf — ABNORMAL HIGH (ref 0–5)
Specific Gravity, Urine: 1.024 (ref 1.005–1.030)
pH: 7 (ref 5.0–8.0)

## 2020-11-13 LAB — BASIC METABOLIC PANEL
Anion gap: 13 (ref 5–15)
BUN: 14 mg/dL (ref 6–20)
CO2: 19 mmol/L — ABNORMAL LOW (ref 22–32)
Calcium: 9.4 mg/dL (ref 8.9–10.3)
Chloride: 105 mmol/L (ref 98–111)
Creatinine, Ser: 1.01 mg/dL — ABNORMAL HIGH (ref 0.44–1.00)
GFR, Estimated: >60 mL/min (ref >60)
Glucose, Bld: 146 mg/dL — ABNORMAL HIGH (ref 70–99)
Potassium: 3.4 mmol/L — ABNORMAL LOW (ref 3.5–5.1)
Sodium: 137 mmol/L (ref 135–145)

## 2020-11-13 LAB — CBC
HCT: 40.5 % (ref 36.0–46.0)
Hemoglobin: 13.5 g/dL (ref 12.0–15.0)
MCH: 31.9 pg (ref 26.0–34.0)
MCHC: 33.3 g/dL (ref 30.0–36.0)
MCV: 95.7 fL (ref 80.0–100.0)
Platelets: 267 10*3/uL (ref 150–400)
RBC: 4.23 MIL/uL (ref 3.87–5.11)
RDW: 10.9 % — ABNORMAL LOW (ref 11.5–15.5)
WBC: 7.5 10*3/uL (ref 4.0–10.5)
nRBC: 0 % (ref 0.0–0.2)

## 2020-11-13 LAB — CBG MONITORING, ED: Glucose-Capillary: 126 mg/dL — ABNORMAL HIGH (ref 70–99)

## 2020-11-13 LAB — I-STAT BETA HCG BLOOD, ED (MC, WL, AP ONLY): I-stat hCG, quantitative: <5 m[IU]/mL (ref <5)

## 2020-11-13 MED ORDER — ONDANSETRON 4 MG PO TBDP
4.0000 mg | ORAL_TABLET | Freq: Once | ORAL | Status: AC
  Administered 2020-11-13: 09:00:00 4 mg via ORAL
  Filled 2020-11-13: qty 1

## 2020-11-13 NOTE — ED Notes (Signed)
Pt checked out AMA. 

## 2020-11-13 NOTE — Discharge Instructions (Signed)
Recommend follow-up with primary doctor.  Stay hydrated.  If you have any additional episodes of passing out or other new concerning symptom, return to ER for reassessment.

## 2020-11-13 NOTE — ED Triage Notes (Addendum)
Patient reports that she was on the toilet having a BM this AM and passed out.  Patient also reports that she was late for her period by 2 1/2 weeks and then when she had her period it lasted longer than usual. Patient also c/o N/V today. patient states when she passed out she hit her head and face on the wall and is now c/o neck pain and soreness where she hit her head.  Patient left [facility] due to wait times. EKG completed and blood work was completed.

## 2020-11-13 NOTE — ED Triage Notes (Signed)
Pt reports that last night she was feeling dizzy and has syncopal episode with nausea.

## 2020-11-13 NOTE — ED Provider Notes (Signed)
[facility]-EMERGENCY DEPT Provider Note   CSN: [id number] Arrival date & time: 11/13/20  0703     History Chief Complaint  Patient presents with  . Loss of Consciousness  . Emesis    [name] is a 29 y.o. female.  Presented to ER after syncopal episode.  Patient reports that last night around 9:00 she did some marijuana, drink some wine.  Did not think that she was particularly intoxicated but also concerned that she was not drinking very much water.  Had some pasta last night.  She was on the toilet late at night having a bowel movement when she passed out.  States that when she passed out she hit her head, has been having intermittent nausea and occasional episode of nonbloody nonbilious emesis since that time.  Still feels mildly nauseous but no ongoing vomiting.  No associated abdominal pain.  Also having some neck pain ever since the syncopal episode.  States that this has happened once previously a long time ago.  Denies any medical problems.  HPI     Past Medical History:  Diagnosis Date  . Anxiety   . Depression   . Headache    migraines  . Heart murmur    as a child  . Herpes    no outbreaks since 5 years ago  . Pregnancy     Patient Active Problem List   Diagnosis Date Noted  . Severe cervical dysplasia 06/02/2018  . Encounter for sterilization 06/02/2018  . Postpartum hemorrhage 10/04/2015  . Cervical laceration 10/04/2015  . 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 Intrauterine normal pregnancy 10/03/2015  . Labor, prolonged latent phase 02/10/2014  . Supervision of other normal pregnancy 02/09/2014    Past Surgical History:  Procedure Laterality Date  . DILATION AND EVACUATION N/A 10/04/2015   Procedure: DILATATION AND EVACUATION;  Surgeon: Myna HidalgoJennifer Ozan, DO;  Location: [facility];  Service: Gynecology;  Laterality: N/A;  . LAPAROSCOPIC TUBAL LIGATION Bilateral 06/02/2018   Procedure: LAPAROSCOPIC TUBAL LIGATION;  Surgeon: Gerald Leitzole, Tara,  MD;  Location: [facility];  Service: Gynecology;  Laterality: Bilateral;  . LEEP N/A 06/02/2018   Procedure: LOOP ELECTROSURGICAL EXCISION PROCEDURE (LEEP);  Surgeon: Gerald Leitzole, Tara, MD;  Location: [facility];  Service: Gynecology;  Laterality: N/A;  . NO PAST SURGERIES    . WISDOM TOOTH EXTRACTION  2017     OB History    Gravida  4   Para  3   Term  3   Preterm  0   AB  0   Living  3     SAB  0   IAB  0   Ectopic  0   Multiple  0   Live Births  3        Obstetric Comments  2009 39 week baby 12 hr labor 2015 39 weeks baby 4 hr labor        Family History  Problem Relation Age of Onset  . Diabetes Mother   . Hypertension Mother     Social History   Tobacco Use  . Smoking status: Former Smoker    Types: Cigarettes  . Smokeless tobacco: Never Used  . Tobacco comment: smoked in high school   Vaping Use  . Vaping Use: Never used  Substance Use Topics  . Alcohol use: Yes    Comment: occ before preg  . Drug use: Yes    Types: Other-see comments, Marijuana    Home Medications Prior to Admission medications   Medication  Sig Start Date End Date Taking? Authorizing Provider  aspirin-acetaminophen-caffeine (EXCEDRIN EXTRA STRENGTH) [phone] MG tablet Take 1-2 tablets by mouth 2 (two) times daily as needed for headache.    [provider]  ferrous sulfate 325 (65 FE) MG tablet Take 1 tablet (325 mg total) by mouth 2 (two) times daily with a meal. Patient not taking: Reported on 08/04/2018 10/06/15   Myna Hidalgo, DO  ibuprofen (ADVIL,MOTRIN) 800 MG tablet Take 1 tablet (800 mg total) by mouth every 8 (eight) hours as needed. Patient not taking: Reported on 06/03/2018 06/02/18   Gerald Leitz, MD  Multiple Vitamin (MULTIVITAMIN WITH MINERALS) TABS tablet Take 1 tablet by mouth every evening.    [provider]  oxyCODONE (OXY IR/ROXICODONE) 5 MG immediate release tablet Take 1 tablet (5 mg total) by mouth every 6 (six) hours as needed for severe  pain. Patient not taking: Reported on 08/04/2018 06/02/18   Gerald Leitz, MD  sertraline (ZOLOFT) 25 MG tablet Take 1 tablet (25 mg total) by mouth daily. Patient not taking: Reported on 08/04/2018 10/06/15   Myna Hidalgo, DO  TURMERIC PO Take 1 capsule by mouth daily.    [provider]    Allergies    Patient has no known allergies.  Review of Systems   Review of Systems  Constitutional: Negative for chills and fever.  HENT: Negative for ear pain and sore throat.   Eyes: Negative for pain and visual disturbance.  Respiratory: Negative for cough and shortness of breath.   Cardiovascular: Negative for chest pain and palpitations.  Gastrointestinal: Negative for abdominal pain and vomiting.  Genitourinary: Negative for dysuria and hematuria.  Musculoskeletal: Negative for arthralgias and back pain.  Skin: Negative for color change and rash.  Neurological: Positive for syncope. Negative for seizures.  All other systems reviewed and are negative.   Physical Exam Updated Vital Signs BP 106/76   Pulse 79   Temp 98.7 F (37.1 C) (Oral)   Resp 14   Ht 5\' 4"  (1.626 m)   Wt 59 kg   LMP 11/03/2020   SpO2 97%   BMI 22.31 kg/m   Physical Exam Vitals and nursing note reviewed.  Constitutional:      General: She is not in acute distress.    Appearance: She is well-developed and well-nourished.  HENT:     Head: Normocephalic and atraumatic.  Eyes:     Conjunctiva/sclera: Conjunctivae normal.  Neck:     Comments: Some tenderness over posterior neck, no step off or defortmity Cardiovascular:     Rate and Rhythm: Normal rate and regular rhythm.     Heart sounds: No murmur heard.   Pulmonary:     Effort: Pulmonary effort is normal. No respiratory distress.     Breath sounds: Normal breath sounds.  Abdominal:     Palpations: Abdomen is soft.     Tenderness: There is no abdominal tenderness.  Musculoskeletal:        General: No swelling, tenderness, deformity, signs of  injury or edema.     Cervical back: Neck supple.  Skin:    General: Skin is warm and dry.  Neurological:     General: No focal deficit present.     Mental Status: She is alert.  Psychiatric:        Mood and Affect: Mood and affect and mood normal.        Behavior: Behavior normal.     ED Results / Procedures / Treatments   Labs (all  labs ordered are listed, but only abnormal results are displayed) Labs Reviewed  CBG MONITORING, ED - Abnormal; Notable for the following components:      Result Value   Glucose-Capillary 126 (*)    All other components within normal limits    EKG None  Radiology DG Chest 2 View  Result Date: 11/13/2020 CLINICAL DATA:  Syncope EXAM: CHEST - 2 VIEW COMPARISON:  None. FINDINGS: Lungs are clear. Heart size and pulmonary vascularity are normal. No adenopathy. No pneumothorax. No bone lesions. IMPRESSION: Lungs clear.  Cardiac silhouette normal. Electronically Signed   By: Bretta Bang III M.D.   On: 11/13/2020 09:55   CT Head Wo Contrast  Result Date: 11/13/2020 CLINICAL DATA:  Syncope, head and neck pain. EXAM: CT HEAD WITHOUT CONTRAST CT CERVICAL SPINE WITHOUT CONTRAST TECHNIQUE: Multidetector CT imaging of the head and cervical spine was performed following the standard protocol without intravenous contrast. Multiplanar CT image reconstructions of the cervical spine were also generated. COMPARISON:  07/15/2012. FINDINGS: CT HEAD FINDINGS Brain: No acute infarct or intracranial hemorrhage. No mass lesion. No midline shift, ventriculomegaly or extra-axial fluid collection. Vascular: No hyperdense vessel or unexpected calcification. Skull: Negative for fracture or focal lesion. Sinuses/Orbits: Normal orbits. Clear paranasal sinuses. No mastoid effusion. Other: None. CT CERVICAL SPINE FINDINGS Alignment: Straightening of lordosis. Skull base and vertebrae: No acute fracture. No primary bone lesion or focal pathologic process. Soft tissues and spinal  canal: No prevertebral fluid or swelling. No visible canal hematoma. Disc levels: No significant spinal canal or neural foraminal narrowing. Upper chest: Clear lung apices. Other: None. IMPRESSION: Head CT: No acute intracranial process. Cervical spine CT: No acute fracture or traumatic listhesis. Electronically Signed   By: Stana Bunting M.D.   On: 11/13/2020 09:57   CT Cervical Spine Wo Contrast  Result Date: 11/13/2020 CLINICAL DATA:  Syncope, head and neck pain. EXAM: CT HEAD WITHOUT CONTRAST CT CERVICAL SPINE WITHOUT CONTRAST TECHNIQUE: Multidetector CT imaging of the head and cervical spine was performed following the standard protocol without intravenous contrast. Multiplanar CT image reconstructions of the cervical spine were also generated. COMPARISON:  07/15/2012. FINDINGS: CT HEAD FINDINGS Brain: No acute infarct or intracranial hemorrhage. No mass lesion. No midline shift, ventriculomegaly or extra-axial fluid collection. Vascular: No hyperdense vessel or unexpected calcification. Skull: Negative for fracture or focal lesion. Sinuses/Orbits: Normal orbits. Clear paranasal sinuses. No mastoid effusion. Other: None. CT CERVICAL SPINE FINDINGS Alignment: Straightening of lordosis. Skull base and vertebrae: No acute fracture. No primary bone lesion or focal pathologic process. Soft tissues and spinal canal: No prevertebral fluid or swelling. No visible canal hematoma. Disc levels: No significant spinal canal or neural foraminal narrowing. Upper chest: Clear lung apices. Other: None. IMPRESSION: Head CT: No acute intracranial process. Cervical spine CT: No acute fracture or traumatic listhesis. Electronically Signed   By: Stana Bunting M.D.   On: 11/13/2020 09:57    Procedures Procedures (including critical care time)  Medications Ordered in ED Medications  ondansetron (ZOFRAN-ODT) disintegrating tablet 4 mg (4 mg Oral Given 11/13/20 3546)    ED Course  I have reviewed the triage  vital signs and the nursing notes.  Pertinent labs & imaging results that were available during my care of the patient were reviewed by me and considered in my medical decision making (see chart for details).    MDM Rules/Calculators/A&P  29 year old lady presenting to the emergency room with concern for syncopal episode last night as well as head trauma.  On exam this morning patient is noted to be remarkably well-appearing.  Having some mild nausea but otherwise no symptoms.  No events on telemetry monitoring, EKG grossly normal.  Basic labs that were performed earlier this morning at [facility] were all within normal limits.  No electrolyte derangements or anemia.  Query mild dehydration.  Suspect episode most likely vasovagal.  Patient tolerating p.o. and remains well-appearing on reassessment.  CT imaging negative.  Will discharge home.   After the discussed management above, the patient was determined to be safe for discharge.  The patient was in agreement with this plan and all questions regarding their care were answered.  ED return precautions were discussed and the patient will return to the ED with any significant worsening of condition.   Final Clinical Impression(s) / ED Diagnoses Final diagnoses:  Dehydration  Vasovagal episode  Syncope and collapse  Marijuana abuse  Alcohol use    Rx / DC Orders ED Discharge Orders    None       Milagros Loll, MD 11/13/20 1017

## 2021-05-04 IMAGING — CR DG CHEST 2V
2 series · 2 of 2 positions shown · non-contrast
Comparison: None.

CLINICAL DATA: Syncope

EXAM:
CHEST - 2 VIEW

[w chest pa]
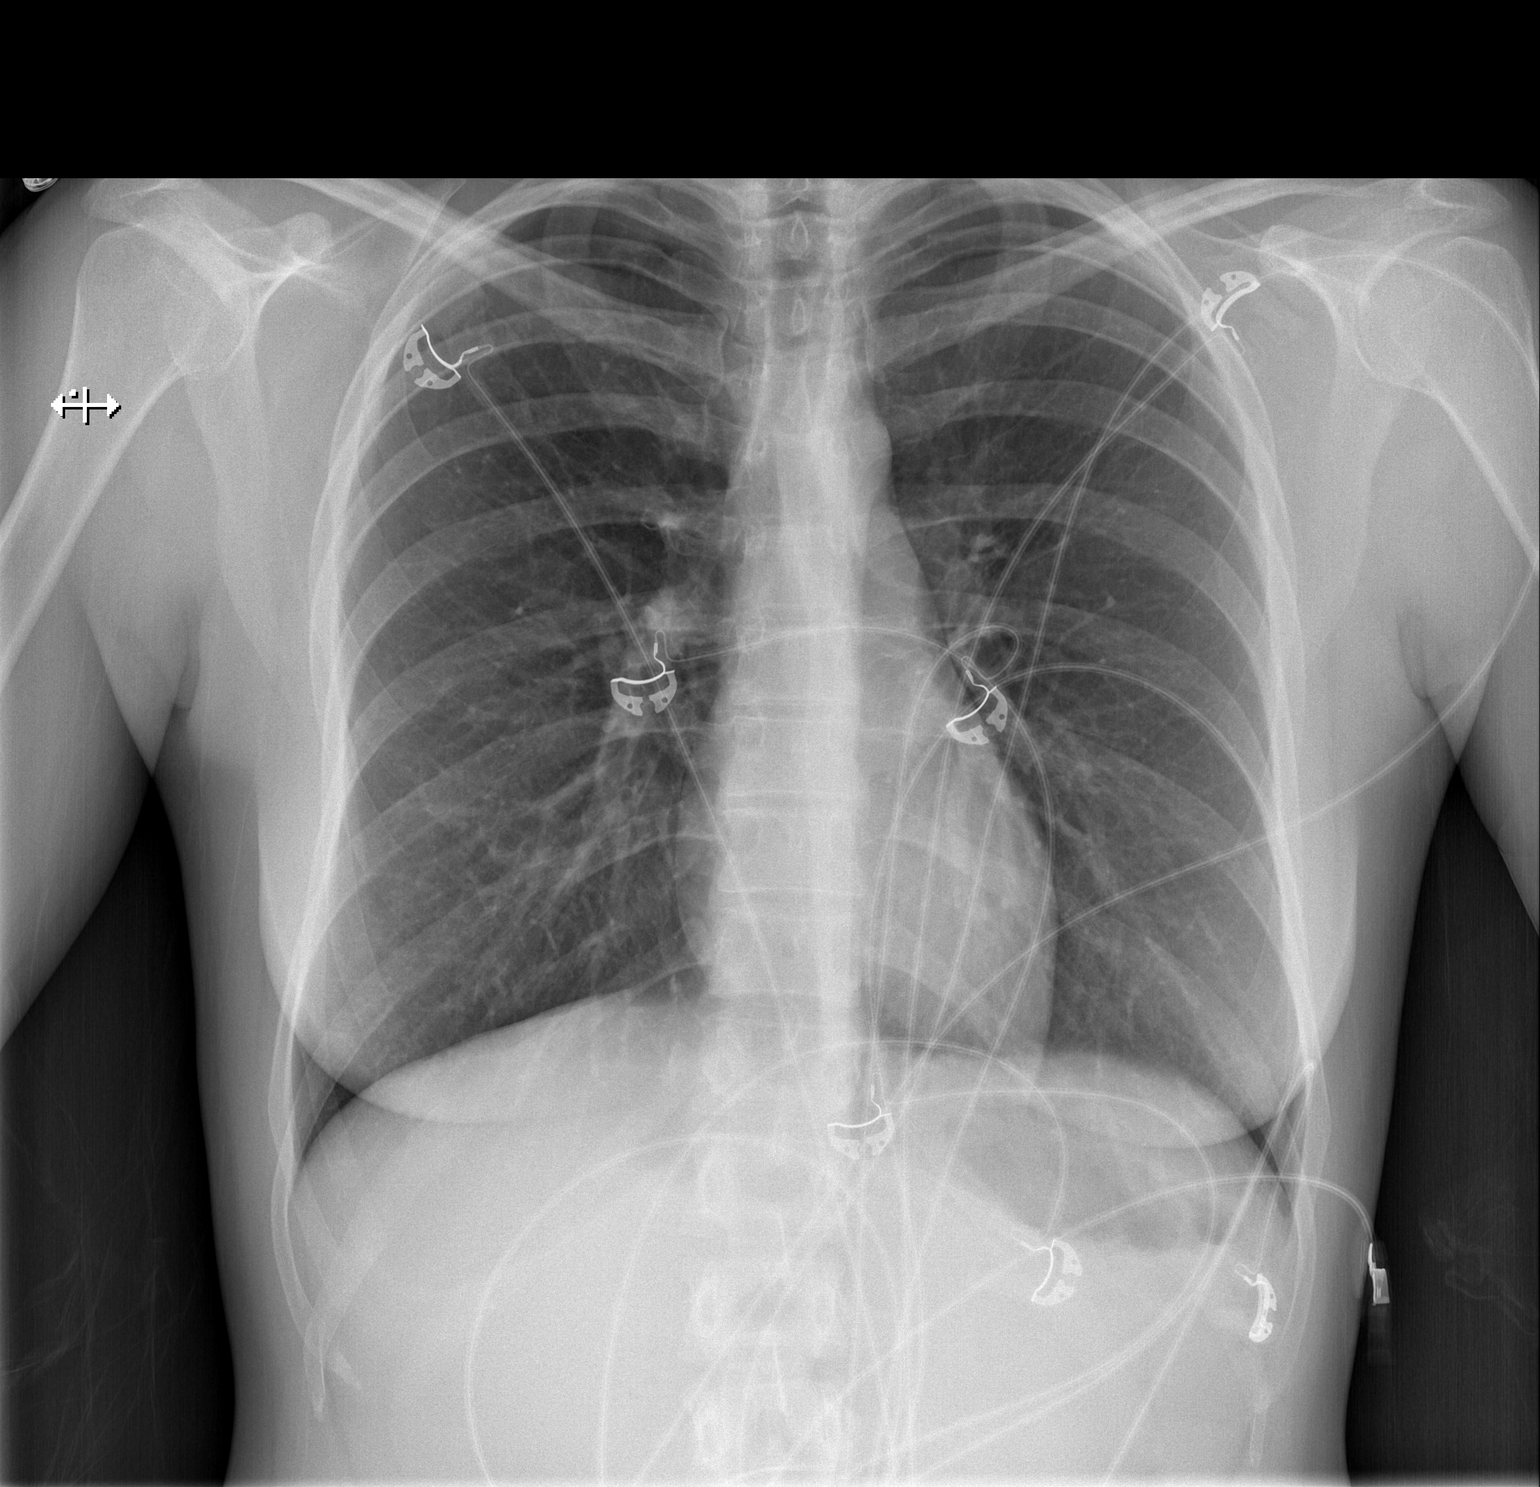

[w chest lat]
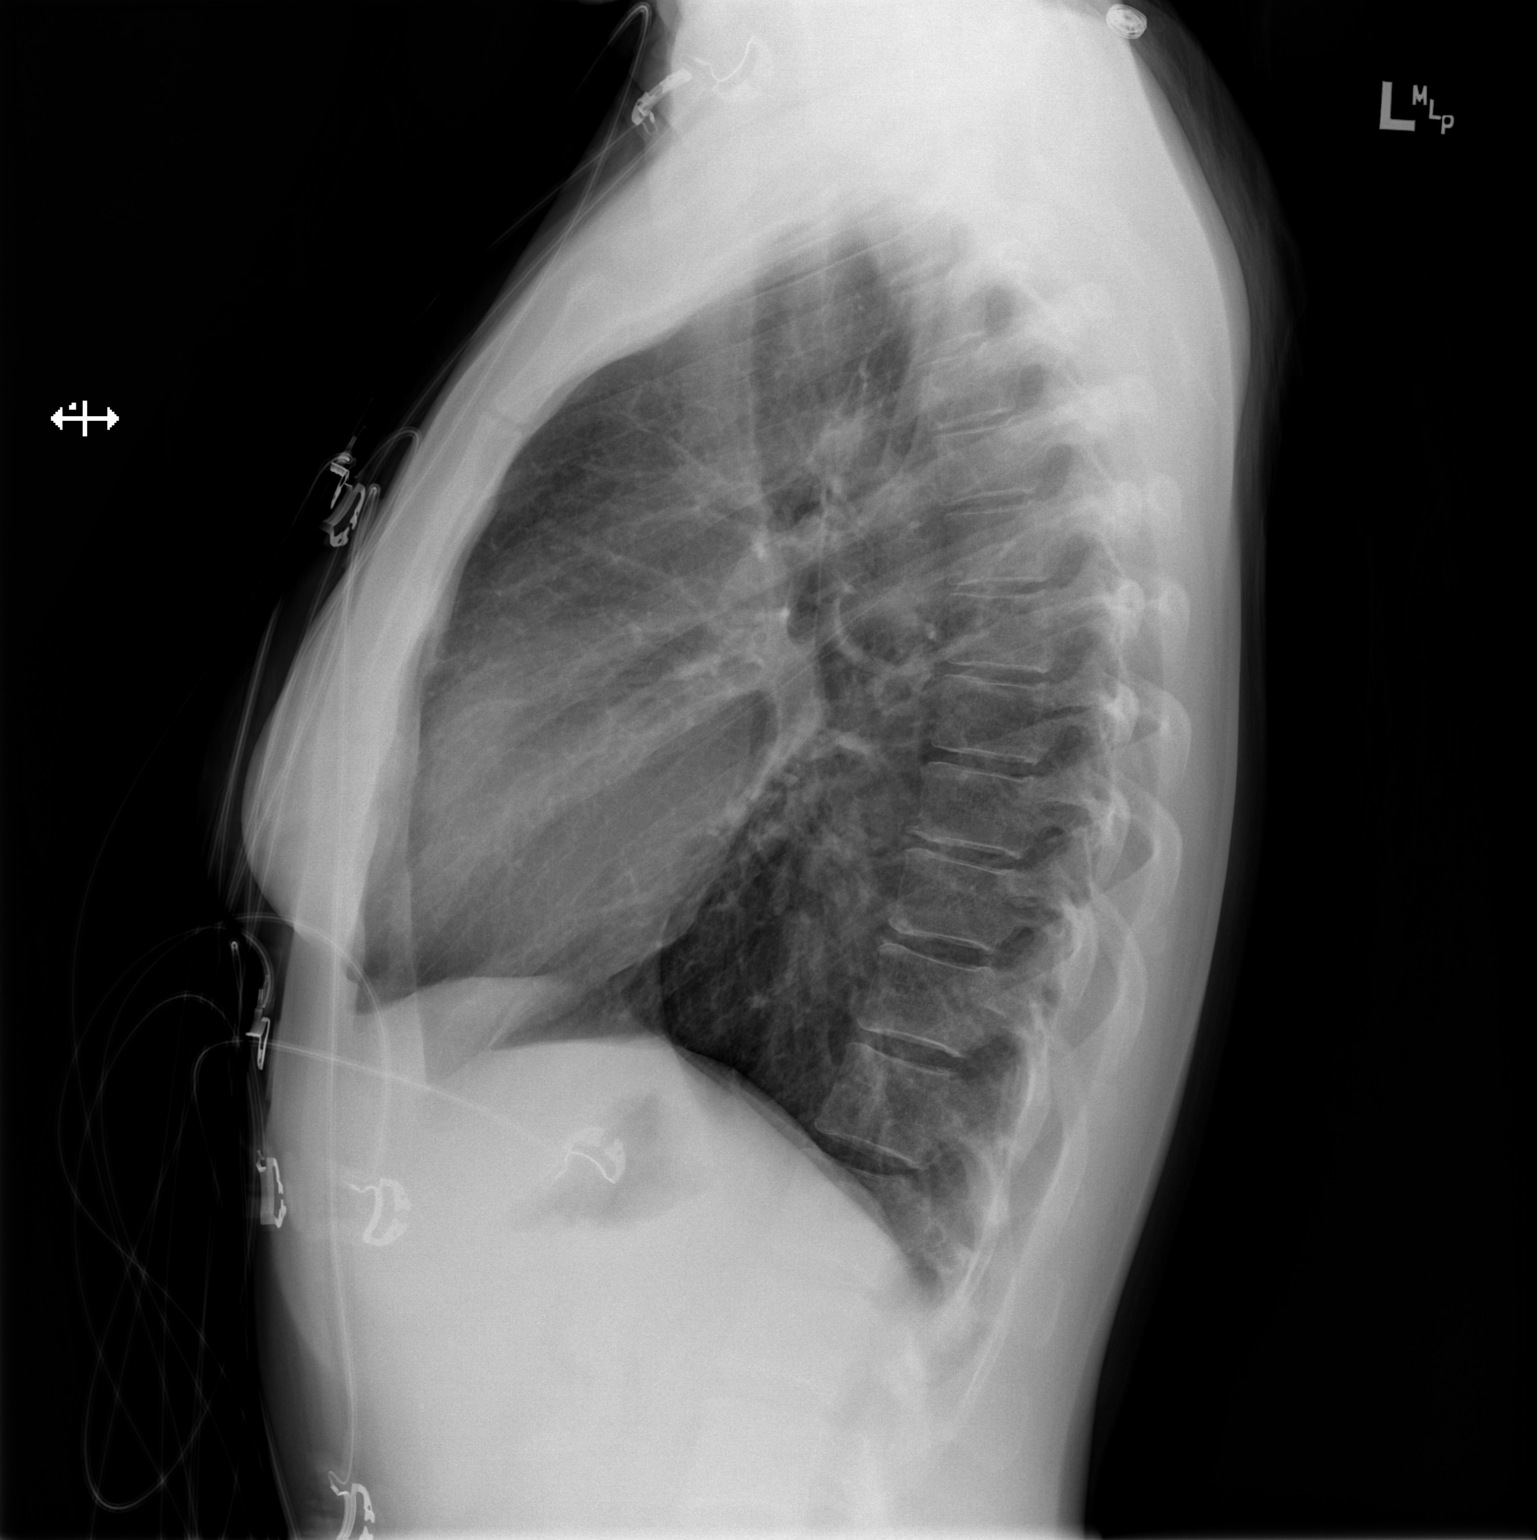

[2 of 2 positions shown; findings below may reference images not displayed]

FINDINGS: Lungs are clear. Heart size and pulmonary vascularity are normal. No
adenopathy. No pneumothorax. No bone lesions.
IMPRESSION: Lungs clear.  Cardiac silhouette normal.

## 2024-04-14 ENCOUNTER — Other Ambulatory Visit: Payer: Self-pay | Admitting: Obstetrics and Gynecology

## 2024-04-14 ENCOUNTER — Other Ambulatory Visit (HOSPITAL_COMMUNITY)
Admission: RE | Admit: 2024-04-14 | Discharge: 2024-04-14 | Disposition: A | Discharge status: Home / Self Care | Source: Ambulatory Visit | Attending: Obstetrics and Gynecology | Admitting: Obstetrics and Gynecology

## 2024-04-14 DIAGNOSIS — Z01419 Encounter for gynecological examination (general) (routine) without abnormal findings: Secondary | ICD-10-CM | POA: Insufficient documentation

## 2024-04-20 LAB — CYTOLOGY - PAP
Comment: NEGATIVE
Diagnosis: NEGATIVE
High risk HPV: NEGATIVE
